# Patient Record
Sex: Female | Born: 2000 | Race: White | Hispanic: No | Marital: Single | Smoking: Former smoker
Health system: Southern US, Community
[De-identification: ages and names within clinical notes are randomized; demographics above are authoritative.]

## PROBLEM LIST (undated history)

## (undated) ENCOUNTER — Inpatient Hospital Stay (HOSPITAL_COMMUNITY): Payer: Self-pay

## (undated) DIAGNOSIS — D649 Anemia, unspecified: Secondary | ICD-10-CM

---
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
## 2020-08-14 DIAGNOSIS — Z8669 Personal history of other diseases of the nervous system and sense organs: Secondary | ICD-10-CM | POA: Insufficient documentation

## 2020-09-09 ENCOUNTER — Emergency Department (HOSPITAL_COMMUNITY): Payer: Medicaid Other

## 2020-09-09 ENCOUNTER — Emergency Department (HOSPITAL_COMMUNITY)
Admission: EM | Admit: 2020-09-09 | Discharge: 2020-09-10 | Disposition: A | Discharge status: Home / Self Care | Payer: Medicaid Other | Attending: Emergency Medicine | Admitting: Emergency Medicine

## 2020-09-09 DIAGNOSIS — F17200 Nicotine dependence, unspecified, uncomplicated: Secondary | ICD-10-CM | POA: Insufficient documentation

## 2020-09-09 DIAGNOSIS — N939 Abnormal uterine and vaginal bleeding, unspecified: Secondary | ICD-10-CM | POA: Insufficient documentation

## 2020-09-09 DIAGNOSIS — R102 Pelvic and perineal pain: Secondary | ICD-10-CM

## 2020-09-09 DIAGNOSIS — R1030 Lower abdominal pain, unspecified: Secondary | ICD-10-CM | POA: Diagnosis not present

## 2020-09-09 LAB — CBC WITH DIFFERENTIAL/PLATELET
Abs Immature Granulocytes: 0.05 10*3/uL (ref 0.00–0.07)
Basophils Absolute: 0.1 10*3/uL (ref 0.0–0.1)
Basophils Relative: 1 %
Eosinophils Absolute: 0.4 10*3/uL (ref 0.0–0.5)
Eosinophils Relative: 5 %
HCT: 40.1 % (ref 36.0–46.0)
Hemoglobin: 12.5 g/dL (ref 12.0–15.0)
Immature Granulocytes: 1 %
Lymphocytes Relative: 41 %
Lymphs Abs: 3.7 10*3/uL (ref 0.7–4.0)
MCH: 25 pg — ABNORMAL LOW (ref 26.0–34.0)
MCHC: 31.2 g/dL (ref 30.0–36.0)
MCV: 80 fL (ref 80.0–100.0)
Monocytes Absolute: 0.6 10*3/uL (ref 0.1–1.0)
Monocytes Relative: 7 %
Neutro Abs: 4.1 10*3/uL (ref 1.7–7.7)
Neutrophils Relative %: 45 %
Platelets: 314 10*3/uL (ref 150–400)
RBC: 5.01 MIL/uL (ref 3.87–5.11)
RDW: 15.7 % — ABNORMAL HIGH (ref 11.5–15.5)
WBC: 8.9 10*3/uL (ref 4.0–10.5)
nRBC: 0 % (ref 0.0–0.2)

## 2020-09-09 LAB — URINALYSIS, ROUTINE W REFLEX MICROSCOPIC
Bilirubin Urine: NEGATIVE
Glucose, UA: NEGATIVE mg/dL
Ketones, ur: NEGATIVE mg/dL
Nitrite: NEGATIVE
Protein, ur: NEGATIVE mg/dL
Specific Gravity, Urine: 1.025 (ref 1.005–1.030)
pH: 5 (ref 5.0–8.0)

## 2020-09-09 LAB — I-STAT BETA HCG BLOOD, ED (MC, WL, AP ONLY): I-stat hCG, quantitative: <5 m[IU]/mL (ref <5)

## 2020-09-09 LAB — COMPREHENSIVE METABOLIC PANEL
ALT: 28 U/L (ref 0–44)
AST: 19 U/L (ref 15–41)
Albumin: 3.4 g/dL — ABNORMAL LOW (ref 3.5–5.0)
Alkaline Phosphatase: 74 U/L (ref 38–126)
Anion gap: 5 (ref 5–15)
BUN: 11 mg/dL (ref 6–20)
CO2: 24 mmol/L (ref 22–32)
Calcium: 8.7 mg/dL — ABNORMAL LOW (ref 8.9–10.3)
Chloride: 111 mmol/L (ref 98–111)
Creatinine, Ser: 0.69 mg/dL (ref 0.44–1.00)
GFR, Estimated: >60 mL/min (ref >60)
Glucose, Bld: 94 mg/dL (ref 70–99)
Potassium: 4.1 mmol/L (ref 3.5–5.1)
Sodium: 140 mmol/L (ref 135–145)
Total Bilirubin: 0.5 mg/dL (ref 0.3–1.2)
Total Protein: 6.6 g/dL (ref 6.5–8.1)

## 2020-09-09 MED ORDER — SODIUM CHLORIDE 0.9 % IV SOLN
2.0000 g | Freq: Once | INTRAVENOUS | Status: AC
  Administered 2020-09-10: 2 g via INTRAVENOUS
  Filled 2020-09-09: qty 20

## 2020-09-09 MED ORDER — HYDROMORPHONE HCL 1 MG/ML IJ SOLN
0.5000 mg | Freq: Once | INTRAMUSCULAR | Status: AC
  Administered 2020-09-10: 0.5 mg via INTRAVENOUS
  Filled 2020-09-09: qty 1

## 2020-09-09 MED ORDER — KETOROLAC TROMETHAMINE 30 MG/ML IJ SOLN
30.0000 mg | Freq: Once | INTRAMUSCULAR | Status: AC
  Administered 2020-09-10: 30 mg via INTRAVENOUS
  Filled 2020-09-09: qty 1

## 2020-09-09 MED ORDER — LACTATED RINGERS IV BOLUS
1000.0000 mL | Freq: Once | INTRAVENOUS | Status: AC
  Administered 2020-09-10: 1000 mL via INTRAVENOUS

## 2020-09-09 MED ORDER — ACETAMINOPHEN 500 MG PO TABS
1000.0000 mg | ORAL_TABLET | Freq: Once | ORAL | Status: AC
  Administered 2020-09-10: 1000 mg via ORAL
  Filled 2020-09-09: qty 2

## 2020-09-09 MED ORDER — HYDROMORPHONE HCL 1 MG/ML IJ SOLN: 1.0000 mg | Freq: Once | INTRAMUSCULAR | Status: DC

## 2020-09-09 NOTE — ED Provider Notes (Signed)
Emergency Medicine Provider Triage Evaluation Note  Phyllis Abelson , a 20 y.o. female  was evaluated in triage.  Pt complains of vaginal bleeding, severe lower abdominal pain.  Patient started having vaginal bleeding 2 days ago.  Proved yesterday, worsened today.  She reports severe lower abdominal pain and cramping.  States that she was seen at an ED in [place] today and had a negative pregnancy test.  Review of Systems  Positive: Vaginal bleeding, lower abdominal pain Negative: Fever  Physical Exam  BP 134/73 (BP Location: Right Arm)   Pulse 65   Temp 98.2 F (36.8 C) (Oral)   Resp 18   SpO2 98%  Gen:   Awake, no distress   Resp:  Normal effort  MSK:   Moves extremities without difficulty Other:    Medical Decision Making  Medically screening exam initiated at 10:04 PM.  Appropriate orders placed.  Janaisa Birkland was informed that the remainder of the evaluation will be completed by another provider, this initial triage assessment does not replace that evaluation, and the importance of remaining in the ED until their evaluation is complete.     Renne Crigler, PA-C 09/09/20 2206    Benjiman Core, MD 09/09/20 2350
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
## 2020-09-09 NOTE — ED Triage Notes (Signed)
Pt c/o 10/10 abdominal pain & "very heavy bleeding" x2-3 days. 1-2 pads per hour. Was told that next pregnancy could be miscarriage, worried for same. LMP 6/14

## 2020-09-10 ENCOUNTER — Emergency Department (HOSPITAL_COMMUNITY): Payer: Medicaid Other

## 2020-09-10 LAB — WET PREP, GENITAL
Clue Cells Wet Prep HPF POC: NONE SEEN
Sperm: NONE SEEN
Trich, Wet Prep: NONE SEEN
Yeast Wet Prep HPF POC: NONE SEEN

## 2020-09-10 MED ORDER — CEPHALEXIN 500 MG PO CAPS: 500.0000 mg | ORAL_CAPSULE | Freq: Four times a day (QID) | ORAL | 0 refills | Status: DC

## 2020-09-10 MED ORDER — NORGESTIMATE-ETH ESTRADIOL 0.25-35 MG-MCG PO TABS: 1.0000 | ORAL_TABLET | Freq: Every day | ORAL | 11 refills | Status: DC

## 2020-09-10 MED ORDER — NORGESTIMATE-ETH ESTRADIOL 0.25-35 MG-MCG PO TABS: 4.0000 | ORAL_TABLET | Freq: Every day | ORAL | 0 refills | Status: DC

## 2020-09-10 NOTE — ED Notes (Signed)
Pt declines dilaudid at this time citing h/o panic attacks after pain meds.  Pt states she prefers to see if tylenol and toradol will improve pain and if not will alert staff that she is willing to take dilaudid.

## 2020-09-10 NOTE — ED Notes (Signed)
Lab to add urine culture.  

## 2020-09-10 NOTE — ED Notes (Signed)
Patient verbalizes understanding of discharge instructions. Opportunity for questioning and answers were provided. Armband removed by staff, pt discharged from ED and ambulated to lobby to return home with SO.  

## 2020-09-11 LAB — URINE CULTURE

## 2020-09-11 LAB — GC/CHLAMYDIA PROBE AMP (~~LOC~~) NOT AT ARMC
Chlamydia: NEGATIVE
Comment: NEGATIVE
Comment: NORMAL
Neisseria Gonorrhea: NEGATIVE

## 2020-12-30 ENCOUNTER — Other Ambulatory Visit: Payer: Self-pay

## 2020-12-30 ENCOUNTER — Emergency Department (HOSPITAL_COMMUNITY)
Admission: EM | Admit: 2020-12-30 | Discharge: 2020-12-31 | Disposition: A | Discharge status: Home / Self Care | Payer: Medicaid Other | Attending: Emergency Medicine | Admitting: Emergency Medicine

## 2020-12-30 ENCOUNTER — Encounter (HOSPITAL_COMMUNITY): Payer: Self-pay

## 2020-12-30 DIAGNOSIS — Y92410 Unspecified street and highway as the place of occurrence of the external cause: Secondary | ICD-10-CM | POA: Insufficient documentation

## 2020-12-30 DIAGNOSIS — R519 Headache, unspecified: Secondary | ICD-10-CM | POA: Insufficient documentation

## 2020-12-30 DIAGNOSIS — Z5321 Procedure and treatment not carried out due to patient leaving prior to being seen by health care provider: Secondary | ICD-10-CM | POA: Diagnosis not present
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
## 2021-06-27 ENCOUNTER — Other Ambulatory Visit: Payer: Self-pay

## 2021-06-27 ENCOUNTER — Inpatient Hospital Stay (HOSPITAL_COMMUNITY)
Admission: AD | Admit: 2021-06-27 | Discharge: 2021-06-28 | Disposition: A | Discharge status: Home / Self Care | Payer: Medicaid Other | Attending: Obstetrics and Gynecology | Admitting: Obstetrics and Gynecology

## 2021-06-27 ENCOUNTER — Encounter (HOSPITAL_COMMUNITY): Payer: Self-pay | Admitting: Obstetrics and Gynecology

## 2021-06-27 DIAGNOSIS — M7989 Other specified soft tissue disorders: Secondary | ICD-10-CM | POA: Diagnosis not present

## 2021-06-27 DIAGNOSIS — Z3A35 35 weeks gestation of pregnancy: Secondary | ICD-10-CM | POA: Insufficient documentation

## 2021-06-27 DIAGNOSIS — R1010 Upper abdominal pain, unspecified: Secondary | ICD-10-CM | POA: Insufficient documentation

## 2021-06-27 DIAGNOSIS — R609 Edema, unspecified: Secondary | ICD-10-CM

## 2021-06-27 DIAGNOSIS — O99333 Smoking (tobacco) complicating pregnancy, third trimester: Secondary | ICD-10-CM | POA: Diagnosis not present

## 2021-06-27 DIAGNOSIS — R109 Unspecified abdominal pain: Secondary | ICD-10-CM | POA: Diagnosis not present

## 2021-06-27 DIAGNOSIS — O26899 Other specified pregnancy related conditions, unspecified trimester: Secondary | ICD-10-CM | POA: Diagnosis not present

## 2021-06-27 DIAGNOSIS — Z79899 Other long term (current) drug therapy: Secondary | ICD-10-CM | POA: Insufficient documentation

## 2021-06-27 DIAGNOSIS — O26893 Other specified pregnancy related conditions, third trimester: Secondary | ICD-10-CM | POA: Insufficient documentation

## 2021-06-27 DIAGNOSIS — R110 Nausea: Secondary | ICD-10-CM

## 2021-06-27 HISTORY — DX: Anemia, unspecified: D64.9

## 2021-06-27 LAB — URINALYSIS, ROUTINE W REFLEX MICROSCOPIC
Bilirubin Urine: NEGATIVE
Glucose, UA: NEGATIVE mg/dL
Hgb urine dipstick: NEGATIVE
Ketones, ur: NEGATIVE mg/dL
Nitrite: NEGATIVE
Protein, ur: NEGATIVE mg/dL
Specific Gravity, Urine: 1.018 (ref 1.005–1.030)
pH: 6 (ref 5.0–8.0)

## 2021-06-27 LAB — CBC
HCT: 33 % — ABNORMAL LOW (ref 36.0–46.0)
Hemoglobin: 10.6 g/dL — ABNORMAL LOW (ref 12.0–15.0)
MCH: 25.7 pg — ABNORMAL LOW (ref 26.0–34.0)
MCHC: 32.1 g/dL (ref 30.0–36.0)
MCV: 79.9 fL — ABNORMAL LOW (ref 80.0–100.0)
Platelets: 254 10*3/uL (ref 150–400)
RBC: 4.13 MIL/uL (ref 3.87–5.11)
RDW: 14.6 % (ref 11.5–15.5)
WBC: 12.1 10*3/uL — ABNORMAL HIGH (ref 4.0–10.5)
nRBC: 0 % (ref 0.0–0.2)

## 2021-06-27 MED ORDER — ACETAMINOPHEN 500 MG PO TABS
1000.0000 mg | ORAL_TABLET | Freq: Once | ORAL | Status: AC
  Administered 2021-06-27: 1000 mg via ORAL
  Filled 2021-06-27: qty 2

## 2021-06-27 MED ORDER — ALUM & MAG HYDROXIDE-SIMETH 200-200-20 MG/5ML PO SUSP
30.0000 mL | Freq: Once | ORAL | Status: AC
Start: 2021-06-27 — End: 2021-06-27
  Administered 2021-06-27: 30 mL via ORAL
  Filled 2021-06-27: qty 30

## 2021-06-27 MED ORDER — LIDOCAINE VISCOUS HCL 2 % MT SOLN
15.0000 mL | Freq: Once | OROMUCOSAL | Status: AC
  Administered 2021-06-27: 15 mL via ORAL
  Filled 2021-06-27: qty 15

## 2021-06-27 NOTE — MAU Note (Signed)
Latasha Peters is a 21 y.o. at Unknown here in MAU reporting: Pt reports at 0200 she had a lot of vaginal pressure where she couldn't close her legs. Pt reports her hands and feet started swelling at 1300. Pt reports red patches on her face 30 minutes after the swelling started. Pt reports sharp pain in her upper abdomen.  ?Onset of complaint: this morning  ?Pain score: 8/10 upper abdomen ?There were no vitals filed for this visit.   ? ?Lab orders placed from triage:  UA  ?

## 2021-06-27 NOTE — MAU Provider Note (Signed)
?History  ?  ? ?CSN: [id number] ? ?Arrival date and time: 06/27/21 2202 ? ? None  ?  ? ?Chief Complaint  ?Patient presents with  ? Abdominal Pain  ? ?HPI ?Latasha Peters is a 21 y.o. G2P1001 at [redacted]w[redacted]d who presents to MAU for swelling in hands, feet and face that started around 1pm today. She denies elevated BP, headache, vision change, or RUQ pain. She reports at the same time, she started having some left upper abdominal cramping, worse when baby kicks. Had an episode of vomiting around 6pm, but this has been normal for her pregnancy. She took Zofran after the vomiting and no longer has nausea. She also reports some vaginal pressure that started last night. The pressure was so bad she couldn't close her legs. She denies contractions, vaginal bleeding, or leaking fluid. Endorses active fetal movement.  ? ?Patient receives [facility].  ? ?OB History   ? ? Gravida  ?2  ? Para  ?1  ? Term  ?1  ? Preterm  ?   ? AB  ?   ? Living  ?1  ?  ? ? SAB  ?   ? IAB  ?   ? Ectopic  ?   ? Multiple  ?   ? Live Births  ?1  ?   ?  ?  ? ? ?Past Medical History:  ?Diagnosis Date  ? Anemia   ? Chiari malformation type I (HCC) 2021  ? ? ?Past Surgical History:  ?Procedure Laterality Date  ? TONSILLECTOMY  2010  ? ? ?History reviewed. No pertinent family history. ? ?Social History  ? ?Tobacco Use  ? Smoking status: Light Smoker  ?  Packs/day: 0.50  ?  Types: Cigarettes  ? Smokeless tobacco: Never  ?Vaping Use  ? Vaping Use: Former  ?Substance Use Topics  ? Alcohol use: Never  ? Drug use: Never  ? ? ?Allergies:  ?Allergies  ?Allergen Reactions  ? Latex Itching and Rash  ? ? ?No medications prior to admission.  ? ? ?Review of Systems  ?Constitutional: Negative.   ?Respiratory: Negative.    ?Cardiovascular: Negative.   ?Gastrointestinal:  Positive for abdominal pain.  ?Genitourinary: Negative.   ?Musculoskeletal:   ?     Swelling hands, feet, face  ?Neurological: Negative.   ?Physical Exam  ? ?Patient Vitals for the past 24 hrs: ? BP Temp  Pulse Resp SpO2  ?06/27/21 2341 115/61 -- 88 -- --  ?06/27/21 2321 122/67 -- 92 -- --  ?06/27/21 2320 122/67 -- 92 -- 99 %  ?06/27/21 2301 121/67 -- 98 -- --  ?06/27/21 2241 120/70 -- 96 -- --  ?06/27/21 2240 120/70 -- 96 -- 99 %  ?06/27/21 2237 119/67 -- 94 -- --  ?06/27/21 2217 130/86 98.4 ?F (36.9 ?C) 89 20 98 %  ? ?Physical Exam ?Vitals and nursing note reviewed. Exam conducted with a chaperone present.  ?Constitutional:   ?   General: She is not in acute distress. ?Cardiovascular:  ?   Rate and Rhythm: Normal rate.  ?Pulmonary:  ?   Effort: Pulmonary effort is normal.  ?Abdominal:  ?   Palpations: Abdomen is soft.  ?   Tenderness: There is no abdominal tenderness.  ?   Comments: Gravid  ?Genitourinary: ?   Comments: VE: 1/50/-3, vtx ?Neurological:  ?   General: No focal deficit present.  ?   Mental Status: She is alert and oriented to person, place, and time.  ?Psychiatric:     ?  Mood and Affect: Mood normal.     ?   Behavior: Behavior normal.  ? ?NST ?FHR: 120 bpm, moderate variability, +15x15 accels, no decels ?Toco: Occasional  ? ?MAU Course  ?Procedures ?NST ? ?MDM ?UA, culture pending ?CBC, CMP, Lipase, UPCR unremarkable ?Serial BP's-all normotensive ?1000mg  Tylenol PO, GI Cocktail ?Cervix 1/50/-3, NST reactive and reassuring. Toco with occasional/sporadic contractions. Patient is not in active labor nor has pre-eclampsia. Patient has appointment tomorrow with her OBGYN and instructed to follow up then. ? ?Assessment and Plan  ?[redacted] weeks gestation of pregnancy ?Swelling ?Nausea ?Abdominal pain during pregnancy ? ?- Discharge home in stable condition ?- Keep OB appointment as scheduled tomorrow 4/28. Return to MAU sooner or as needed for worsening symptoms ? ? ?5/28, CNM ?06/28/2021, 1:09 AM  ?

## 2021-06-28 DIAGNOSIS — Z3A35 35 weeks gestation of pregnancy: Secondary | ICD-10-CM

## 2021-06-28 DIAGNOSIS — R109 Unspecified abdominal pain: Secondary | ICD-10-CM

## 2021-06-28 DIAGNOSIS — O26899 Other specified pregnancy related conditions, unspecified trimester: Secondary | ICD-10-CM

## 2021-06-28 LAB — COMPREHENSIVE METABOLIC PANEL
ALT: 19 U/L (ref 0–44)
AST: 19 U/L (ref 15–41)
Albumin: 2.3 g/dL — ABNORMAL LOW (ref 3.5–5.0)
Alkaline Phosphatase: 186 U/L — ABNORMAL HIGH (ref 38–126)
Anion gap: 6 (ref 5–15)
BUN: <5 mg/dL — ABNORMAL LOW (ref 6–20)
CO2: 21 mmol/L — ABNORMAL LOW (ref 22–32)
Calcium: 8.3 mg/dL — ABNORMAL LOW (ref 8.9–10.3)
Chloride: 109 mmol/L (ref 98–111)
Creatinine, Ser: 0.46 mg/dL (ref 0.44–1.00)
GFR, Estimated: >60 mL/min (ref >60)
Glucose, Bld: 101 mg/dL — ABNORMAL HIGH (ref 70–99)
Potassium: 3.7 mmol/L (ref 3.5–5.1)
Sodium: 136 mmol/L (ref 135–145)
Total Bilirubin: 0.2 mg/dL — ABNORMAL LOW (ref 0.3–1.2)
Total Protein: 5.8 g/dL — ABNORMAL LOW (ref 6.5–8.1)

## 2021-06-28 LAB — PROTEIN / CREATININE RATIO, URINE
Creatinine, Urine: 164.43 mg/dL
Protein Creatinine Ratio: 0.11 mg/mg{Cre} (ref 0.00–0.15)
Total Protein, Urine: 18 mg/dL

## 2021-06-28 LAB — LIPASE, BLOOD: Lipase: 27 U/L (ref 11–51)

## 2021-06-29 LAB — CULTURE, OB URINE: Culture: 20000 — AB

## 2021-07-05 ENCOUNTER — Other Ambulatory Visit: Payer: Self-pay

## 2021-07-05 ENCOUNTER — Encounter (HOSPITAL_COMMUNITY): Payer: Self-pay | Admitting: Family Medicine

## 2021-07-05 ENCOUNTER — Inpatient Hospital Stay (HOSPITAL_COMMUNITY)
Admission: AD | Admit: 2021-07-05 | Discharge: 2021-07-06 | Disposition: A | Discharge status: Home / Self Care | Payer: Medicaid Other | Attending: Family Medicine | Admitting: Family Medicine

## 2021-07-05 DIAGNOSIS — F17210 Nicotine dependence, cigarettes, uncomplicated: Secondary | ICD-10-CM | POA: Insufficient documentation

## 2021-07-05 DIAGNOSIS — Z79899 Other long term (current) drug therapy: Secondary | ICD-10-CM | POA: Insufficient documentation

## 2021-07-05 DIAGNOSIS — N898 Other specified noninflammatory disorders of vagina: Secondary | ICD-10-CM

## 2021-07-05 DIAGNOSIS — O99333 Smoking (tobacco) complicating pregnancy, third trimester: Secondary | ICD-10-CM | POA: Insufficient documentation

## 2021-07-05 DIAGNOSIS — O479 False labor, unspecified: Secondary | ICD-10-CM

## 2021-07-05 DIAGNOSIS — Z3A36 36 weeks gestation of pregnancy: Secondary | ICD-10-CM

## 2021-07-05 LAB — POCT FERN TEST: POCT Fern Test: NEGATIVE

## 2021-07-05 NOTE — MAU Note (Signed)
Latasha Peters is a 21 y.o. at [redacted]w[redacted]d here in MAU reporting: Pt reports ctx's that started 1-2 hours ago. Py reports a small leak before the ctx's started.  ?Reports bloody show in mucous plug.  ?Reports +FM  ? ?Onset of complaint: today ?Pain score: 10/10 ?There were no vitals filed for this visit.   ? ?Lab orders placed from triage:  none ?

## 2021-07-05 NOTE — MAU Provider Note (Signed)
Chief Complaint:  Contractions ? ? Event Date/Time  ? First Provider Initiated Contact with Patient 07/05/21 2110   ? ? HPI: Latasha Peters is a 21 y.o. G2P1001 at 65w4dwho presents to maternity admissions reporting painful contractions and possible leaking of fluid. [name] ?She reports good fetal movement, denies urinary symptoms, h/a, dizziness, n/v, diarrhea, constipation or fever/chills.. ? ?Vaginal Discharge ?The patient's primary symptoms include pelvic pain and vaginal discharge. The patient's pertinent negatives include no genital itching or genital odor. This is a new problem. The current episode started today. The problem has been unchanged. Associated symptoms include abdominal pain. Pertinent negatives include no constipation, diarrhea, fever, frequency, nausea or vomiting. The vaginal discharge was white and watery. There has been no bleeding. She has not been passing clots. She has not been passing tissue. Nothing aggravates the symptoms. She has tried nothing for the symptoms.  ? ? ?RN Note: ?[name] is a 21 y.o. at [redacted]w[redacted]d here in MAU reporting: Pt reports ctx's that started 1-2 hours ago. Py reports a small leak before the ctx's started.  ?Reports bloody show in mucous plug.  ?Reports +FM   ?Onset of complaint: today ?Pain score: 10/10 ? ?Past Medical History: ?Past Medical History:  ?Diagnosis Date  ? Anemia   ? Chiari malformation type I (HCC) 2021  ? ? ?Past obstetric history: ?OB History  ?Gravida Para Term Preterm AB Living  ?2 1 1     1   ?SAB IAB Ectopic Multiple Live Births  ?        1  ?  ?# Outcome Date GA Lbr Len/2nd Weight Sex Delivery Anes PTL Lv  ?2 Current           ?1 Term 2021     Vag-Spont   LIV  ? ? ?Past Surgical History: ?Past Surgical History:  ?Procedure Laterality Date  ? TONSILLECTOMY  2010  ? ? ?Family History: ?History reviewed. No pertinent family history. ? ?Social History: ?Social History  ? ?Tobacco Use  ? Smoking status: Light Smoker  ?  Packs/day: 0.50  ?  Types:  Cigarettes  ? Smokeless tobacco: Never  ?Vaping Use  ? Vaping Use: Former  ?Substance Use Topics  ? Alcohol use: Never  ? Drug use: Never  ? ? ?Allergies:  ?Allergies  ?Allergen Reactions  ? Latex Itching and Rash  ? ? ?Meds:  ?Medications Prior to Admission  ?Medication Sig Dispense Refill Last Dose  ? acetaminophen (TYLENOL) 650 MG CR tablet Take 1,300 mg by mouth every 8 (eight) hours as needed for pain.     ? cyclobenzaprine (FLEXERIL) 10 MG tablet Take 1 tablet (10 mg total) by mouth 3 (three) times daily as needed. (Patient not taking: Reported on 09/10/2020) 30 tablet 0   ? Elastic Bandages & Supports (COMFORT FIT MATERNITY SUPP SM) MISC 1 Units by Does not apply route daily as needed. (Patient not taking: Reported on 09/10/2020) 1 each 0   ? venlafaxine XR (EFFEXOR-XR) 37.5 MG 24 hr capsule Take 37.5 mg by mouth daily.     ? ? ?I have reviewed patient's Past Medical Hx, Surgical Hx, Family Hx, Social Hx, medications and allergies.  ? ?ROS:  ?Review of Systems  ?Constitutional:  Negative for fever.  ?Gastrointestinal:  Positive for abdominal pain. Negative for constipation, diarrhea, nausea and vomiting.  ?Genitourinary:  Positive for pelvic pain and vaginal discharge. Negative for frequency.  ?Other systems negative ? ?Physical Exam  ?Patient Vitals for the past 24 hrs: ? BP  Temp Pulse Resp SpO2  ?07/05/21 2058 129/81 -- (!) 108 -- --  ?07/05/21 2057 -- 98.3 ?F (36.8 ?C) -- 16 99 %  ? ?Constitutional: Well-developed, well-nourished female in no acute distress.  ?Cardiovascular: normal rate and rhythm ?Respiratory: normal effort, clear to auscultation bilaterally ?GI: Abd soft, non-tender, gravid appropriate for gestational age.   No rebound or guarding. ?MS: Extremities nontender, no edema, normal ROM ?Neurologic: Alert and oriented x 4.  ?GU: Neg CVAT. ? ?PELVIC EXAM:  ?Sterile speculum exam done:  Thick mucous discharge noted. No pooling , no ferning noted.  DIscussed with patient that this means she is not  ruptured ?Dilation: 2 ?Effacement (%): 50 ?Station: -3 ?Presentation: Vertex ?Exam by:: Beather Arbour ? ?FHT:  Baseline 140 , moderate variability, accelerations present, no decelerations ?Contractions:  Irregular   ?  ?Labs: ?Results for orders placed or performed during the hospital encounter of 07/05/21 (from the past 24 hour(s))  ?Fern Test     Status: None  ? Collection Time: 07/05/21  9:50 PM  ?Result Value Ref Range  ? POCT Fern Test Negative = intact amniotic membranes   ? ? ? ? Imaging:  ?No results found. ? ?MAU Course/MDM: ?I have reviewed the triage vital signs and the nursing notes. ?  ?Pertinent labs that were available during my care of the patient were reviewed by me and considered in my medical decision making (see chart for details).      I have reviewed her medical records including past results, notes and treatments.  ? ?NST reviewed, reactive by criteria, category I ?.  ?Treatments in MAU included NST, SSExam.   ? ?Assessment: ?Single IUP at [redacted]w[redacted]d ?Irregular uterine contractions, prodromal vs latent labor ?NST reactive ?No evidence of ruptured membranes ? ?Plan: ?Discharge home ?Labor precautions and fetal kick counts ?Follow up in Office for prenatal visits  ?Encouraged to return if she develops worsening of symptoms, increase in pain, fever, or other concerning symptoms. ? ?Pt stable at time of discharge. ? ?Wynelle Bourgeois CNM, MSN ?Certified Nurse-Midwife ?07/05/2021 ?9:16 PM ?

## 2021-07-05 NOTE — Progress Notes (Signed)
RN offered pt to go home after unchanged SVE or to stay another hour due to pt's level of discomfort. Pt states it would take her ride an hour to get here. RN informed pt that we can keep her another hour and offer another SVE. RN informed Dr. Adah Salvage of discussion with pt along with SVE, membrane status, and uterine activity. Dr. Adah Salvage stated it is ok to d/c pt after 1 hour and recheck cervix before she leaves.  ?

## 2021-07-06 DIAGNOSIS — Z3A36 36 weeks gestation of pregnancy: Secondary | ICD-10-CM | POA: Diagnosis not present

## 2021-07-06 DIAGNOSIS — N898 Other specified noninflammatory disorders of vagina: Secondary | ICD-10-CM | POA: Diagnosis not present

## 2021-07-06 DIAGNOSIS — Z79899 Other long term (current) drug therapy: Secondary | ICD-10-CM | POA: Diagnosis not present

## 2021-07-06 DIAGNOSIS — O479 False labor, unspecified: Secondary | ICD-10-CM

## 2021-07-06 DIAGNOSIS — O26893 Other specified pregnancy related conditions, third trimester: Secondary | ICD-10-CM | POA: Diagnosis not present

## 2021-07-06 DIAGNOSIS — F17210 Nicotine dependence, cigarettes, uncomplicated: Secondary | ICD-10-CM | POA: Diagnosis not present

## 2021-07-06 DIAGNOSIS — O99333 Smoking (tobacco) complicating pregnancy, third trimester: Secondary | ICD-10-CM | POA: Diagnosis not present

## 2022-10-05 IMAGING — US US PELVIS COMPLETE
1 series · 14 of 25 positions shown · non-contrast
Comparison: None.

CLINICAL DATA: Pelvic pain and heavy bleeding

EXAM:
TRANSABDOMINAL ULTRASOUND OF PELVIS
DOPPLER ULTRASOUND OF OVARIES
TECHNIQUE: Transabdominal ultrasound examination of the pelvis was performed
including evaluation of the uterus, ovaries, adnexal regions, and
pelvic cul-de-sac. Transvaginal imaging was refused by the patient.
Color and duplex Doppler ultrasound was utilized to evaluate blood
flow to the ovaries.

[Series 1: us pelvis (transabdominal only) · 14 of 52 slices shown]
[im 1/52]
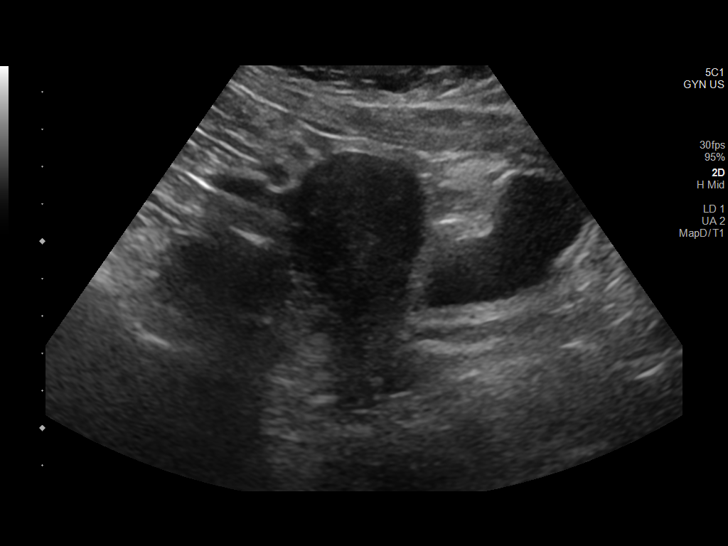
[im 5/52]
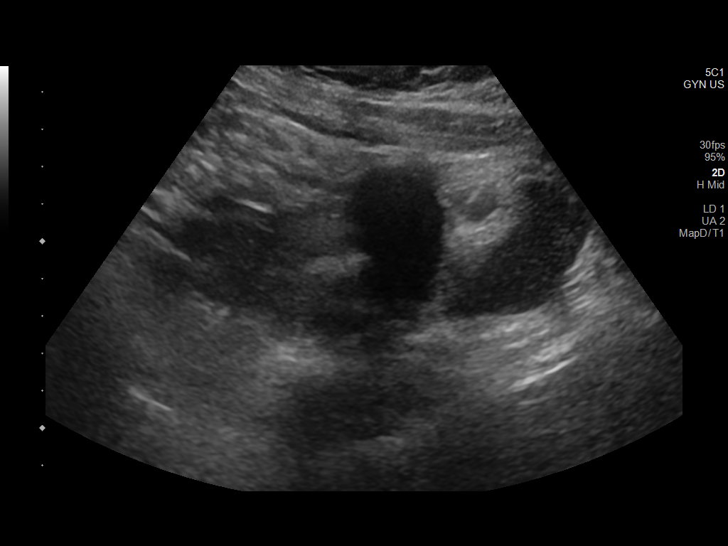
[im 9/52]
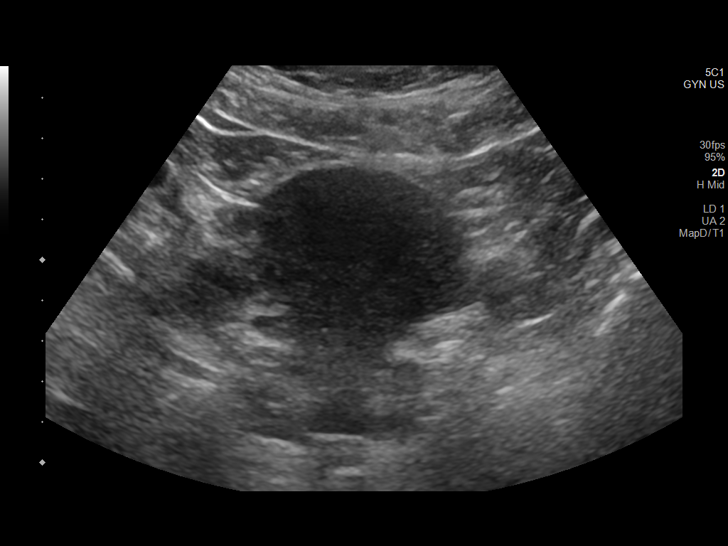
[im 13/52]
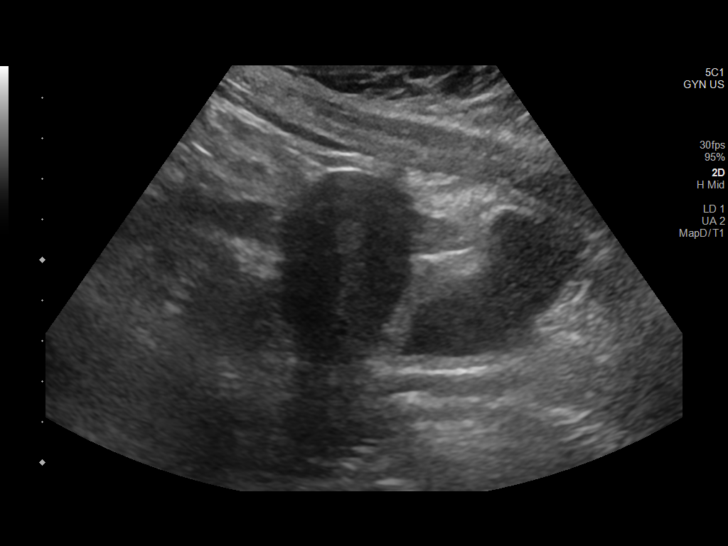
[im 18/52]
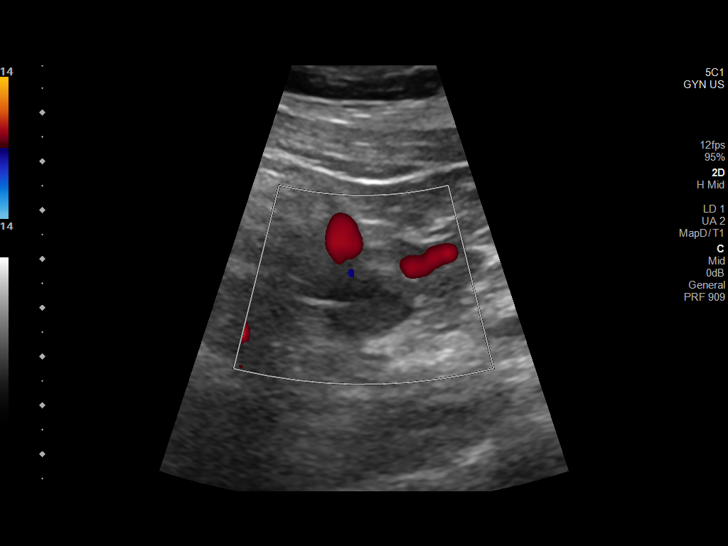
[im 20/52]
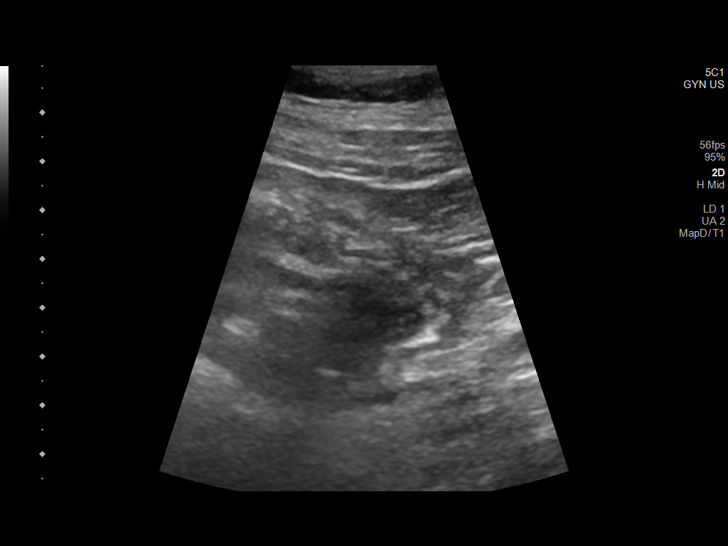
[im 24/52]
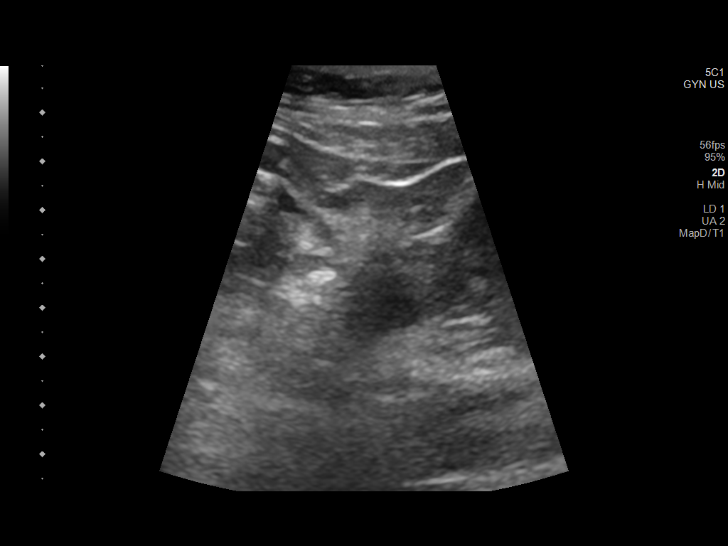
[im 28/52]
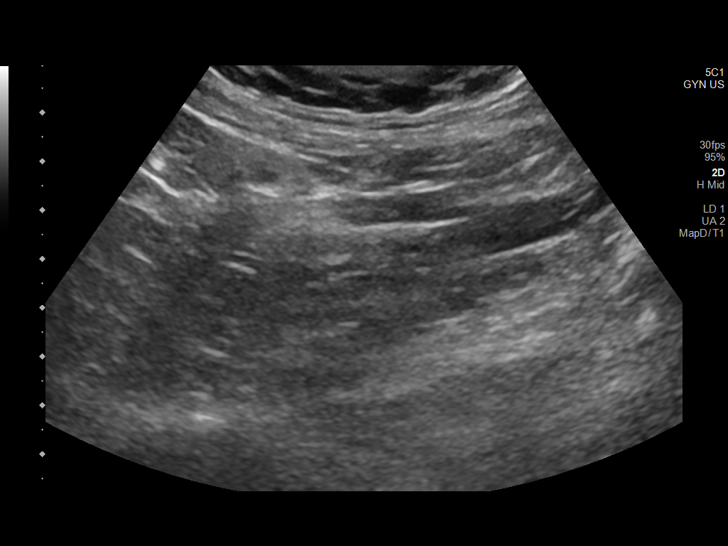
[im 32/52]
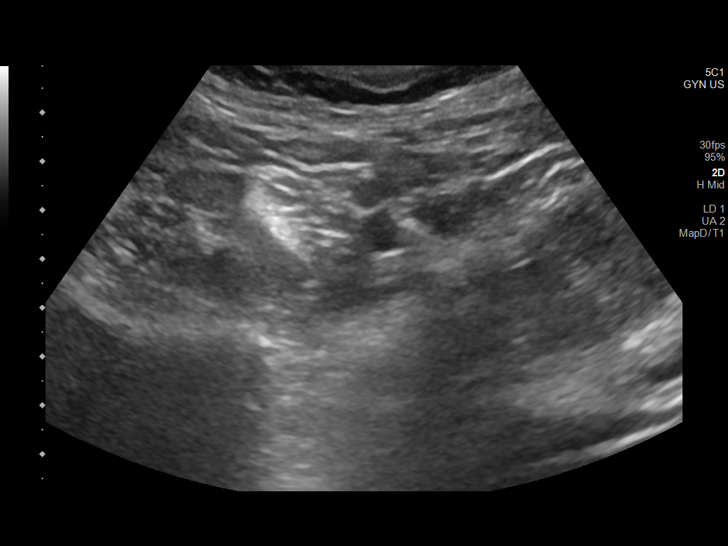
[im 35/52]
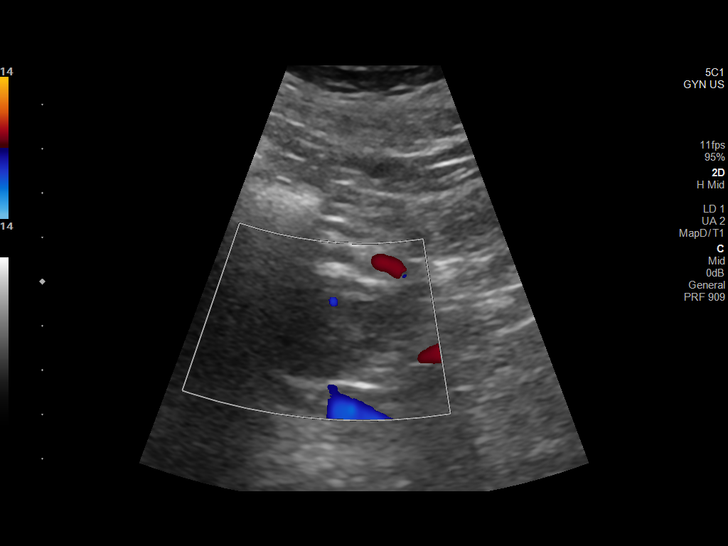
[im 39/52]
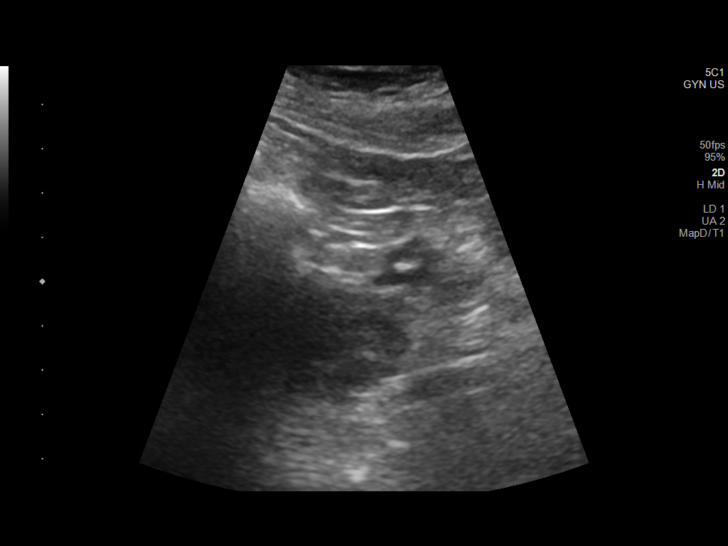
[im 43/52]
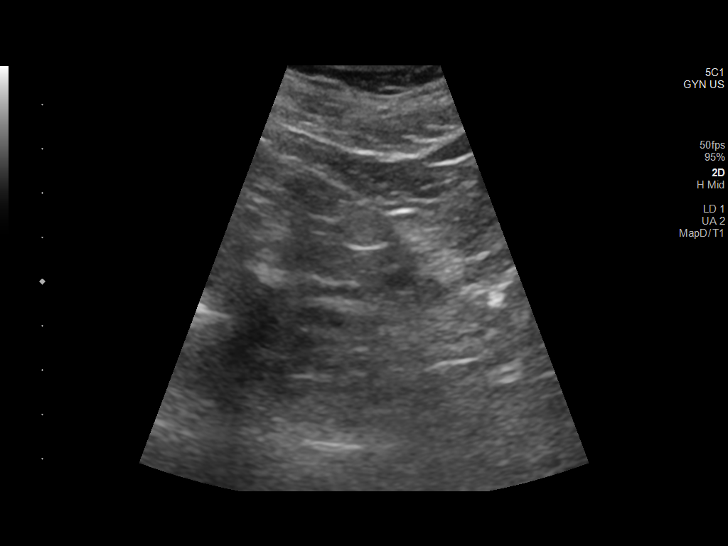
[im 47/52]
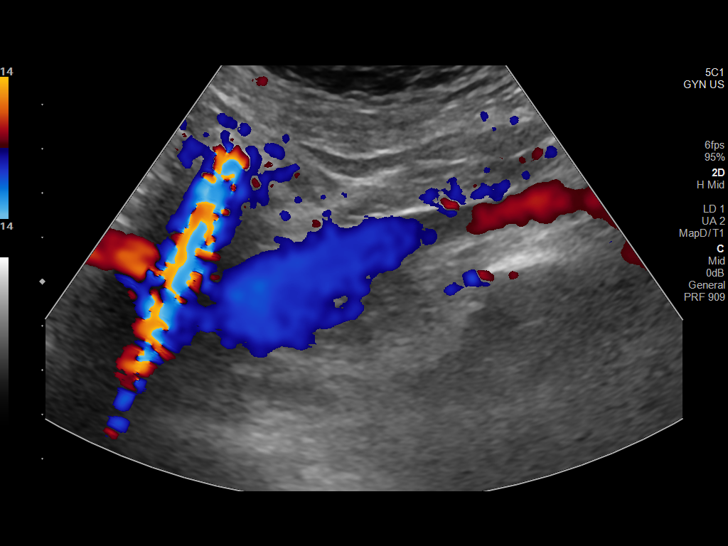
[im 52/52]
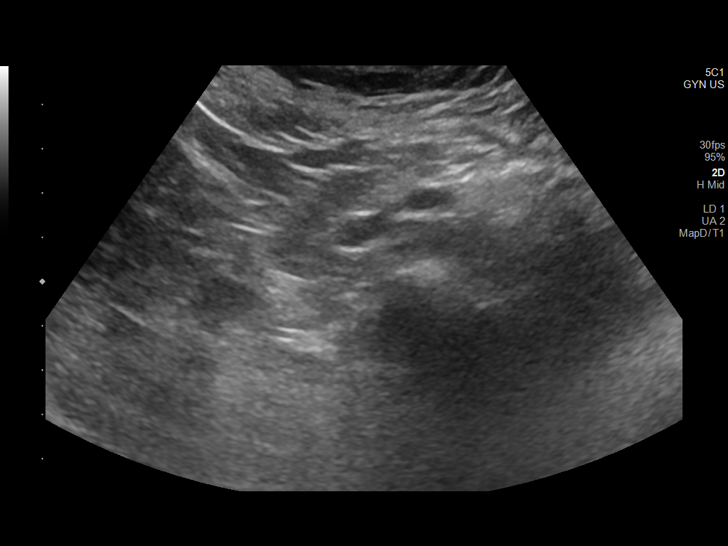

[14 of 25 positions shown; findings below may reference images not displayed]

FINDINGS: Uterus

Measurements: 6.5 x 4.4 x 5.2 cm. = volume: 77 mL. No fibroids or
other mass visualized.

Endometrium

Thickness: 8 mm.  No focal abnormality visualized.

Right ovary

Measurements: 2.0 x 1.5 x 1.7 cm. = volume: 2.7 mL. Normal
appearance/no adnexal mass.

Left ovary

Measurements: 2.6 x 1.7 x 3.0 cm. = volume: 8.7 mL. Normal
appearance/no adnexal mass.

Pulsed Doppler evaluation demonstrates normal low-resistance
arterial and venous waveforms in both ovaries.

Other: None
IMPRESSION: Unremarkable uterus and ovaries. No evidence of ovarian torsion is
seen.

## 2023-02-03 DIAGNOSIS — F419 Anxiety disorder, unspecified: Secondary | ICD-10-CM | POA: Insufficient documentation

## 2023-02-03 DIAGNOSIS — F32A Depression, unspecified: Secondary | ICD-10-CM | POA: Insufficient documentation

## 2023-06-16 ENCOUNTER — Other Ambulatory Visit: Payer: Self-pay | Admitting: Medical Genetics

## 2023-08-25 ENCOUNTER — Other Ambulatory Visit (HOSPITAL_COMMUNITY)

## 2023-09-09 ENCOUNTER — Encounter: Payer: Self-pay | Admitting: Women's Health

## 2023-09-09 DIAGNOSIS — O26899 Other specified pregnancy related conditions, unspecified trimester: Secondary | ICD-10-CM | POA: Insufficient documentation

## 2023-09-15 ENCOUNTER — Ambulatory Visit (INDEPENDENT_AMBULATORY_CARE_PROVIDER_SITE_OTHER): Admitting: Women's Health

## 2023-09-15 ENCOUNTER — Encounter: Payer: Self-pay | Admitting: Women's Health

## 2023-09-15 ENCOUNTER — Ambulatory Visit: Admitting: *Deleted

## 2023-09-15 ENCOUNTER — Other Ambulatory Visit (HOSPITAL_COMMUNITY)
Admission: RE | Admit: 2023-09-15 | Discharge: 2023-09-15 | Disposition: A | Discharge status: Home / Self Care | Source: Ambulatory Visit | Attending: Women's Health | Admitting: Women's Health

## 2023-09-15 VITALS — BP 105/69 | HR 83 | Wt 156.0 lb

## 2023-09-15 DIAGNOSIS — O26891 Other specified pregnancy related conditions, first trimester: Secondary | ICD-10-CM

## 2023-09-15 DIAGNOSIS — F32A Depression, unspecified: Secondary | ICD-10-CM

## 2023-09-15 DIAGNOSIS — O0990 Supervision of high risk pregnancy, unspecified, unspecified trimester: Secondary | ICD-10-CM | POA: Insufficient documentation

## 2023-09-15 DIAGNOSIS — Z1332 Encounter for screening for maternal depression: Secondary | ICD-10-CM

## 2023-09-15 DIAGNOSIS — Z3A12 12 weeks gestation of pregnancy: Secondary | ICD-10-CM | POA: Diagnosis not present

## 2023-09-15 DIAGNOSIS — Z6791 Unspecified blood type, Rh negative: Secondary | ICD-10-CM | POA: Insufficient documentation

## 2023-09-15 DIAGNOSIS — F418 Other specified anxiety disorders: Secondary | ICD-10-CM

## 2023-09-15 DIAGNOSIS — Z31430 Encounter of female for testing for genetic disease carrier status for procreative management: Secondary | ICD-10-CM

## 2023-09-15 DIAGNOSIS — O26899 Other specified pregnancy related conditions, unspecified trimester: Secondary | ICD-10-CM

## 2023-09-15 DIAGNOSIS — Z3481 Encounter for supervision of other normal pregnancy, first trimester: Secondary | ICD-10-CM

## 2023-09-15 DIAGNOSIS — F419 Anxiety disorder, unspecified: Secondary | ICD-10-CM

## 2023-09-15 DIAGNOSIS — Z3480 Encounter for supervision of other normal pregnancy, unspecified trimester: Secondary | ICD-10-CM | POA: Insufficient documentation

## 2023-09-15 MED ORDER — BLOOD PRESSURE MONITOR MISC: 0 refills | Status: AC

## 2023-09-15 NOTE — Patient Instructions (Signed)
 [facility], thank you for choosing our office today! We appreciate the opportunity to meet your healthcare needs. You may receive a short survey by mail, e-mail, or through [employer]. If you are happy with your care we would appreciate if you could take just a few minutes to complete the survey questions. We read all of your comments and take your feedback very seriously. Thank you again for choosing our office.  [facility] [employer] [facility] Team at [facility]  [facility] at [facility] ([address] [place], [place] [address]) Entrance C, located off of [address] Free 24/7 valet parking   Nausea & Vomiting Have saltine crackers or pretzels by your bed and eat a few bites before you raise your head out of bed in the morning Eat small frequent meals throughout the day instead of large meals Drink plenty of fluids throughout the day to stay hydrated, just don't drink a lot of fluids with your meals.  This can make your stomach fill up faster making you feel sick Do not brush your teeth right after you eat Products with real ginger are good for nausea, like ginger ale and ginger hard candy Make sure it says made with real ginger! Sucking on sour candy like lemon heads is also good for nausea If your prenatal vitamins make you nauseated, take them at night so you will sleep through the nausea Sea Bands If you feel like you need medicine for the nausea & vomiting please let us know If you are unable to keep any fluids or food down please let us know   Constipation Drink plenty of fluid, preferably water, throughout the day Eat foods high in fiber such as fruits, vegetables, and grains Exercise, such as walking, is a good way to keep your bowels regular Drink warm fluids, especially warm prune juice, or decaf coffee Eat a 1/2 cup of real oatmeal (not instant), 1/2 cup applesauce, and 1/2-1 cup warm prune juice every day If needed, you may take Colace (docusate sodium) stool softener  once or twice a day to help keep the stool soft.  If you still are having problems with constipation, you may take Miralax once daily as needed to help keep your bowels regular.   Home Blood Pressure Monitoring for Patients   Your provider has recommended that you check your blood pressure (BP) at least once a week at home. If you do not have a blood pressure cuff at home, one will be provided for you. Contact your provider if you have not received your monitor within 1 week.   Helpful Tips for Accurate Home Blood Pressure Checks  Don't smoke, exercise, or drink caffeine 30 minutes before checking your BP Use the restroom before checking your BP (a full bladder can raise your pressure) Relax in a comfortable upright chair Feet on the ground Left arm resting comfortably on a flat surface at the level of your heart Legs uncrossed Back supported Sit quietly and don't talk Place the cuff on your bare arm Adjust snuggly, so that only two fingertips can fit between your skin and the top of the cuff Check 2 readings separated by at least one minute Keep a log of your BP readings For a visual, please reference this diagram: http://ccnc.care/bpdiagram  Provider Name: [facility]     Phone: [phone]  Zone 1: ALL CLEAR  Continue to monitor your symptoms:  BP reading is less than 140 (top number) or less than 90 (bottom  number)  No right upper stomach pain No headaches or seeing spots No feeling nauseated or throwing up No swelling in face and hands  Zone 2: CAUTION Call your doctor's office for any of the following:  BP reading is greater than 140 (top number) or greater than 90 (bottom number)  Stomach pain under your ribs in the middle or right side Headaches or seeing spots Feeling nauseated or throwing up Swelling in face and hands  Zone 3: EMERGENCY  Seek immediate medical care if you have any of the following:  BP reading is greater than160 (top number) or greater than  110 (bottom number) Severe headaches not improving with Tylenol Serious difficulty catching your breath Any worsening symptoms from Zone 2    First Trimester of Pregnancy The first trimester of pregnancy is from week 1 until the end of week 12 (months 1 through 3). A week after a sperm fertilizes an egg, the egg will implant on the wall of the uterus. This embryo will begin to develop into a baby. Genes from you and your partner are forming the baby. The female genes determine whether the baby is a boy or a girl. At 6-8 weeks, the eyes and face are formed, and the heartbeat can be seen on ultrasound. At the end of 12 weeks, all the baby's organs are formed.  Now that you are pregnant, you will want to do everything you can to have a healthy baby. Two of the most important things are to get good prenatal care and to follow your health care provider's instructions. Prenatal care is all the medical care you receive before the baby's birth. This care will help prevent, find, and treat any problems during the pregnancy and childbirth. BODY CHANGES Your body goes through many changes during pregnancy. The changes vary from woman to woman.  You may gain or lose a couple of pounds at first. You may feel sick to your stomach (nauseous) and throw up (vomit). If the vomiting is uncontrollable, call your health care provider. You may tire easily. You may develop headaches that can be relieved by medicines approved by your health care provider. You may urinate more often. Painful urination may mean you have a bladder infection. You may develop heartburn as a result of your pregnancy. You may develop constipation because certain hormones are causing the muscles that push waste through your intestines to slow down. You may develop hemorrhoids or swollen, bulging veins (varicose veins). Your breasts may begin to grow larger and become tender. Your nipples may stick out more, and the tissue that surrounds them  (areola) may become darker. Your gums may bleed and may be sensitive to brushing and flossing. Dark spots or blotches (chloasma, mask of pregnancy) may develop on your face. This will likely fade after the baby is born. Your menstrual periods will stop. You may have a loss of appetite. You may develop cravings for certain kinds of food. You may have changes in your emotions from day to day, such as being excited to be pregnant or being concerned that something may go wrong with the pregnancy and baby. You may have more vivid and strange dreams. You may have changes in your hair. These can include thickening of your hair, rapid growth, and changes in texture. Some women also have hair loss during or after pregnancy, or hair that feels dry or thin. Your hair will most likely return to normal after your baby is born. WHAT TO EXPECT AT YOUR PRENATAL  VISITS During a routine prenatal visit: You will be weighed to make sure you and the baby are growing normally. Your blood pressure will be taken. Your abdomen will be measured to track your baby's growth. The fetal heartbeat will be listened to starting around week 10 or 12 of your pregnancy. Test results from any previous visits will be discussed. Your health care provider may ask you: How you are feeling. If you are feeling the baby move. If you have had any abnormal symptoms, such as leaking fluid, bleeding, severe headaches, or abdominal cramping. If you have any questions. Other tests that may be performed during your first trimester include: Blood tests to find your blood type and to check for the presence of any previous infections. They will also be used to check for low iron levels (anemia) and Rh antibodies. Later in the pregnancy, blood tests for diabetes will be done along with other tests if problems develop. Urine tests to check for infections, diabetes, or protein in the urine. An ultrasound to confirm the proper growth and development  of the baby. An amniocentesis to check for possible genetic problems. Fetal screens for spina bifida and Down syndrome. You may need other tests to make sure you and the baby are doing well. HOME CARE INSTRUCTIONS  Medicines Follow your health care provider's instructions regarding medicine use. Specific medicines may be either safe or unsafe to take during pregnancy. Take your prenatal vitamins as directed. If you develop constipation, try taking a stool softener if your health care provider approves. Diet Eat regular, well-balanced meals. Choose a variety of foods, such as meat or vegetable-based protein, fish, milk and low-fat dairy products, vegetables, fruits, and whole grain breads and cereals. Your health care provider will help you determine the amount of weight gain that is right for you. Avoid raw meat and uncooked cheese. These carry germs that can cause birth defects in the baby. Eating four or five small meals rather than three large meals a day may help relieve nausea and vomiting. If you start to feel nauseous, eating a few soda crackers can be helpful. Drinking liquids between meals instead of during meals also seems to help nausea and vomiting. If you develop constipation, eat more high-fiber foods, such as fresh vegetables or fruit and whole grains. Drink enough fluids to keep your urine clear or pale yellow. Activity and Exercise Exercise only as directed by your health care provider. Exercising will help you: Control your weight. Stay in shape. Be prepared for labor and delivery. Experiencing pain or cramping in the lower abdomen or low back is a good sign that you should stop exercising. Check with your health care provider before continuing normal exercises. Try to avoid standing for long periods of time. Move your legs often if you must stand in one place for a long time. Avoid heavy lifting. Wear low-heeled shoes, and practice good posture. You may continue to have sex  unless your health care provider directs you otherwise. Relief of Pain or Discomfort Wear a good support bra for breast tenderness.   Take warm sitz baths to soothe any pain or discomfort caused by hemorrhoids. Use hemorrhoid cream if your health care provider approves.   Rest with your legs elevated if you have leg cramps or low back pain. If you develop varicose veins in your legs, wear support hose. Elevate your feet for 15 minutes, 3-4 times a day. Limit salt in your diet. Prenatal Care Schedule your prenatal visits by the  twelfth week of pregnancy. They are usually scheduled monthly at first, then more often in the last 2 months before delivery. Write down your questions. Take them to your prenatal visits. Keep all your prenatal visits as directed by your health care provider. Safety Wear your seat belt at all times when driving. Make a list of emergency phone numbers, including numbers for family, friends, the hospital, and police and fire departments. General Tips Ask your health care provider for a referral to a local prenatal education class. Begin classes no later than at the beginning of month 6 of your pregnancy. Ask for help if you have counseling or nutritional needs during pregnancy. Your health care provider can offer advice or refer you to specialists for help with various needs. Do not use hot tubs, steam rooms, or saunas. Do not douche or use tampons or scented sanitary pads. Do not cross your legs for long periods of time. Avoid cat litter boxes and soil used by cats. These carry germs that can cause birth defects in the baby and possibly loss of the fetus by miscarriage or stillbirth. Avoid all smoking, herbs, alcohol, and medicines not prescribed by your health care provider. Chemicals in these affect the formation and growth of the baby. Schedule a dentist appointment. At home, brush your teeth with a soft toothbrush and be gentle when you floss. SEEK MEDICAL CARE IF:   You have dizziness. You have mild pelvic cramps, pelvic pressure, or nagging pain in the abdominal area. You have persistent nausea, vomiting, or diarrhea. You have a bad smelling vaginal discharge. You have pain with urination. You notice increased swelling in your face, hands, legs, or ankles. SEEK IMMEDIATE MEDICAL CARE IF:  You have a fever. You are leaking fluid from your vagina. You have spotting or bleeding from your vagina. You have severe abdominal cramping or pain. You have rapid weight gain or loss. You vomit blood or material that looks like coffee grounds. You are exposed to [place] measles and have never had them. You are exposed to fifth disease or chickenpox. You develop a severe headache. You have shortness of breath. You have any kind of trauma, such as from a fall or a car accident. Document Released: 02/12/2001 Document Revised: 07/05/2013 Document Reviewed: 12/29/2012 [facility] Patient Information 2015 [place], [place]. This information is not intended to replace advice given to you by your health care provider. Make sure you discuss any questions you have with your health care provider.

## 2023-09-15 NOTE — Progress Notes (Signed)
 INITIAL OBSTETRICAL VISIT Patient name: Latasha Peters MRN [id number]  Date of birth: Dec 16, 2000 Chief Complaint:   Initial Prenatal Visit  History of Present Illness:   Latasha Peters is a 23 y.o. [phone] Caucasian female at [redacted]w[redacted]d by US  at 8 weeks with an Estimated Date of Delivery: 03/26/24 being seen today for her initial obstetrical visit.   Patient's last menstrual period was 05/25/2023 (approximate). Her obstetrical history is significant for SAB x 1, term uncomplicated SVB x 2. Chiari malformation, no problems Dep/anx- no meds in doing well, OK w/ [facility] referral   Today she reports n/v improving, declines meds.  Last pap never. Results were: N/A     09/15/2023    2:35 PM  Depression screen PHQ 2/9  Decreased Interest 0  Down, Depressed, Hopeless 0  PHQ - 2 Score 0  Altered sleeping 0  Tired, decreased energy 3  Change in appetite 1  Feeling bad or failure about yourself  0  Trouble concentrating 1  Moving slowly or fidgety/restless 0  Suicidal thoughts 0  PHQ-9 Score 5        09/15/2023    2:35 PM  GAD 7 : Generalized Anxiety Score  Nervous, Anxious, on Edge 0  Control/stop worrying 0  Worry too much - different things 0  Trouble relaxing 0  Restless 0  Easily annoyed or irritable 0  Afraid - awful might happen 2  Total GAD 7 Score 2     Review of Systems:   Pertinent items are noted in HPI Denies cramping/contractions, leakage of fluid, vaginal bleeding, abnormal vaginal discharge w/ itching/odor/irritation, headaches, visual changes, shortness of breath, chest pain, abdominal pain, severe nausea/vomiting, or problems with urination or bowel movements unless otherwise stated above.  Pertinent History Reviewed:  Reviewed past medical,surgical, social, obstetrical and family history.  Reviewed problem list, medications and allergies. OB History  Gravida Para Term Preterm AB Living  4 2 2  1 2   SAB IAB Ectopic Multiple Live Births  1    2    # Outcome  Date GA Lbr Len/2nd Weight Sex Type Anes PTL Lv  4 Current           3 SAB 01/2023          2 Term 07/24/21 [redacted]w[redacted]d  8 lb 7.1 oz (3.83 kg)  Vag-Spont EPI  LIV  1 Term 10/27/19 [redacted]w[redacted]d  7 lb 11 oz (3.487 kg) F Vag-Spont EPI  LIV   Physical Assessment:   Vitals:   09/15/23 1431  BP: 105/69  Pulse: 83  Weight: 156 lb (70.8 kg)  Body mass index is 27.63 kg/m.       Physical Examination:  General appearance - well appearing, and in no distress  Mental status - alert, oriented to person, place, and time  Psych:  She has a normal mood and affect  Skin - warm and dry, normal color, no suspicious lesions noted  Chest - effort normal, all lung fields clear to auscultation bilaterally  Heart - normal rate and regular rhythm  Abdomen - soft, nontender  Extremities:  No swelling or varicosities noted  Pelvic - VULVA: normal appearing vulva with no masses, tenderness or lesions  VAGINA: normal appearing vagina with normal color and discharge, no lesions  CERVIX: normal appearing cervix without discharge or lesions, no CMT  Thin prep pap is done w/ reflex HR HPV cotesting  Chaperone: Aleck Blase  TODAY'S  informal TA u/s +FHR and active fetus  No  results found for this or any previous visit (from the past 24 hours).  Assessment & Plan:  1) Low-Risk Pregnancy [id number] at [redacted]w[redacted]d with an Estimated Date of Delivery: 03/26/24   2) Initial OB visit  3) Chiari malformation> no problems  4) Dep/anx> no meds in doing well, IBH referral  Meds:  Meds ordered this encounter  Medications   Blood Pressure Monitor MISC    Sig: For regular home bp monitoring during pregnancy    Dispense:  1 each    Refill:  0    Z34.81 Please mail to patient    Initial labs obtained Continue prenatal vitamins Reviewed n/v relief measures and warning s/s to report Reviewed recommended weight gain based on pre-gravid BMI Encouraged well-balanced diet Genetic & carrier screening discussed: requests  Panorama, AFP, and Horizon , NT/IT currently unavailable Ultrasound discussed; fetal survey: requested [facility]NC completed> form faxed if has or is planning to apply for medicaid The nature of [place] [facility] [employer] with multiple MDs and other Advanced Practice Providers was explained to patient; also emphasized that fellows, residents, and students are part of our team. Does not have home bp cuff. Office bp cuff given: no. Rx sent: yes. Check bp weekly, let us  know if consistently >140/90.   Follow-up: Return in about 4 weeks (around 10/13/2023) for LROB, AFP, CNM in person; then 8wks from now anatomy u/s and LROB w/ CNM.   Orders Placed This Encounter  Procedures   Urine Culture   CBC/D/Plt+RPR+Rh+ABO+RubIgG...   PANORAMA PRENATAL TEST   HORIZON CUSTOM   Hemoglobin A1c   Amb ref to [facility]    Suzen JONELLE Fetters CNM, [facility] 09/15/2023 2:57 PM

## 2023-09-16 ENCOUNTER — Ambulatory Visit: Payer: Self-pay | Admitting: Women's Health

## 2023-09-16 DIAGNOSIS — Z2839 Other underimmunization status: Secondary | ICD-10-CM | POA: Insufficient documentation

## 2023-09-16 DIAGNOSIS — Z3480 Encounter for supervision of other normal pregnancy, unspecified trimester: Secondary | ICD-10-CM

## 2023-09-16 LAB — CBC/D/PLT+RPR+RH+ABO+RUBIGG...
Antibody Screen: NEGATIVE
Basophils Absolute: 0 x10E3/uL (ref 0.0–0.2)
Basos: 0 %
EOS (ABSOLUTE): 0.2 x10E3/uL (ref 0.0–0.4)
Eos: 2 %
HCV Ab: NONREACTIVE
HIV Screen 4th Generation wRfx: NONREACTIVE
Hematocrit: 38.1 % (ref 34.0–46.6)
Hemoglobin: 12.9 g/dL (ref 11.1–15.9)
Hepatitis B Surface Ag: NEGATIVE
Immature Grans (Abs): 0 x10E3/uL (ref 0.0–0.1)
Immature Granulocytes: 0 %
Lymphocytes Absolute: 3.2 x10E3/uL — ABNORMAL HIGH (ref 0.7–3.1)
Lymphs: 29 %
MCH: 29.9 pg (ref 26.6–33.0)
MCHC: 33.9 g/dL (ref 31.5–35.7)
MCV: 88 fL (ref 79–97)
Monocytes Absolute: 0.6 x10E3/uL (ref 0.1–0.9)
Monocytes: 6 %
Neutrophils Absolute: 7 x10E3/uL (ref 1.4–7.0)
Neutrophils: 63 %
Platelets: 241 x10E3/uL (ref 150–450)
RBC: 4.31 x10E6/uL (ref 3.77–5.28)
RDW: 13.2 % (ref 11.7–15.4)
RPR Ser Ql: NONREACTIVE
Rh Factor: NEGATIVE
Rubella Antibodies, IGG: <0.9 {index} — ABNORMAL LOW (ref >0.99)
WBC: 11.1 x10E3/uL — ABNORMAL HIGH (ref 3.4–10.8)

## 2023-09-16 LAB — HCV INTERPRETATION

## 2023-09-16 LAB — HEMOGLOBIN A1C
Est. average glucose Bld gHb Est-mCnc: 97 mg/dL
Hgb A1c MFr Bld: 5 % (ref 4.8–5.6)

## 2023-09-17 LAB — URINE CULTURE: Organism ID, Bacteria: NO GROWTH

## 2023-09-22 LAB — CYTOLOGY - PAP
Chlamydia: NEGATIVE
Comment: NEGATIVE
Comment: NEGATIVE
Comment: NORMAL
Diagnosis: NEGATIVE
High risk HPV: NEGATIVE
Neisseria Gonorrhea: NEGATIVE

## 2023-09-23 LAB — HORIZON CUSTOM: REPORT SUMMARY: NEGATIVE

## 2023-10-01 ENCOUNTER — Telehealth: Payer: Self-pay | Admitting: Clinical

## 2023-10-01 NOTE — Telephone Encounter (Signed)
Attempt call regarding referral; Left HIPPA-compliant message to call back Mikle Sternberg from [facility] at  [phone] (Ashelyn Mccravy's office).    

## 2023-10-02 ENCOUNTER — Telehealth: Payer: Self-pay | Admitting: Women's Health

## 2023-10-02 NOTE — Telephone Encounter (Signed)
 Patient just called wanting to speak so someone in regards to her sugar states that she has been having dizzy spells and light headed, wants someone to call her

## 2023-10-13 ENCOUNTER — Encounter: Payer: Self-pay | Admitting: Women's Health

## 2023-10-13 ENCOUNTER — Ambulatory Visit: Admitting: Women's Health

## 2023-10-13 VITALS — BP 101/63 | HR 64 | Wt 164.0 lb

## 2023-10-13 DIAGNOSIS — Z3A16 16 weeks gestation of pregnancy: Secondary | ICD-10-CM

## 2023-10-13 DIAGNOSIS — Z3480 Encounter for supervision of other normal pregnancy, unspecified trimester: Secondary | ICD-10-CM

## 2023-10-13 DIAGNOSIS — Z363 Encounter for antenatal screening for malformations: Secondary | ICD-10-CM

## 2023-10-13 DIAGNOSIS — Z3482 Encounter for supervision of other normal pregnancy, second trimester: Secondary | ICD-10-CM | POA: Diagnosis not present

## 2023-10-13 DIAGNOSIS — Z1379 Encounter for other screening for genetic and chromosomal anomalies: Secondary | ICD-10-CM | POA: Diagnosis not present

## 2023-10-13 NOTE — Progress Notes (Signed)
    LOW-RISK PREGNANCY VISIT Patient name: Latasha Peters MRN [id number]  Date of birth: 2000-11-29 Chief Complaint:   Routine Prenatal Visit (AFP)  History of Present Illness:   Latasha Peters is a 23 y.o. [phone] female at [redacted]w[redacted]d with an Estimated Date of Delivery: 03/26/24 being seen today for ongoing management of a low-risk pregnancy.   Today she reports no complaints.  .  .  Movement: Absent. denies leaking of fluid.     09/15/2023    2:35 PM  Depression screen PHQ 2/9  Decreased Interest 0  Down, Depressed, Hopeless 0  PHQ - 2 Score 0  Altered sleeping 0  Tired, decreased energy 3  Change in appetite 1  Feeling bad or failure about yourself  0  Trouble concentrating 1  Moving slowly or fidgety/restless 0  Suicidal thoughts 0  PHQ-9 Score 5        09/15/2023    2:35 PM  GAD 7 : Generalized Anxiety Score  Nervous, Anxious, on Edge 0  Control/stop worrying 0  Worry too much - different things 0  Trouble relaxing 0  Restless 0  Easily annoyed or irritable 0  Afraid - awful might happen 2  Total GAD 7 Score 2      Review of Systems:   Pertinent items are noted in HPI Denies abnormal vaginal discharge w/ itching/odor/irritation, headaches, visual changes, shortness of breath, chest pain, abdominal pain, severe nausea/vomiting, or problems with urination or bowel movements unless otherwise stated above. Pertinent History Reviewed:  Reviewed past medical,surgical, social, obstetrical and family history.  Reviewed problem list, medications and allergies. Physical Assessment:   Vitals:   10/13/23 1342  BP: 101/63  Pulse: 64  Weight: 164 lb (74.4 kg)  Body mass index is 29.05 kg/m.        Physical Examination:   General appearance: Well appearing, and in no distress  Mental status: Alert, oriented to person, place, and time  Skin: Warm & dry  Cardiovascular: Normal heart rate noted  Respiratory: Normal respiratory effort, no distress  Abdomen: Soft, gravid,  nontender  Pelvic: Cervical exam deferred         Extremities: Edema: None  Fetal Status: Fetal Heart Rate (bpm): 158   Movement: Absent    Chaperone: N/A No results found for this or any previous visit (from the past 24 hours).  Assessment & Plan:  1) Low-risk pregnancy [id number] at [redacted]w[redacted]d with an Estimated Date of Delivery: 03/26/24   Meds: No orders of the defined types were placed in this encounter.  Labs/procedures today: AFP  Plan:  Continue routine obstetrical care  Next visit: prefers will be in person for u/s    Reviewed: Preterm labor symptoms and general obstetric precautions including but not limited to vaginal bleeding, contractions, leaking of fluid and fetal movement were reviewed in detail with the patient.  All questions were answered. Does have home bp cuff. Office bp cuff given: not applicable. Check bp weekly, let us  know if consistently >140 and/or >90.  Follow-up: No follow-ups on file.  Future Appointments  Date Time Provider Department Center  11/10/2023  8:30 AM [facility] US  [facility] None  11/10/2023  9:30 AM Kizzie Suzen SAUNDERS, CNM [facility]    Orders Placed This Encounter  Procedures   US  OB Comp + 14 Wk   AFP, Serum, Open Spina Bifida   Suzen SAUNDERS Kizzie CNM, [facility] 10/13/2023 2:11 PM

## 2023-10-16 ENCOUNTER — Ambulatory Visit: Payer: Self-pay | Admitting: Women's Health

## 2023-10-16 DIAGNOSIS — Z3480 Encounter for supervision of other normal pregnancy, unspecified trimester: Secondary | ICD-10-CM

## 2023-10-16 LAB — AFP, SERUM, OPEN SPINA BIFIDA
AFP MoM: 1.24
AFP Value: 38.8 ng/mL
Gest. Age on Collection Date: 16 wk
Maternal Age At EDD: 23.4 a
OSBR Risk 1 IN: 5748
Test Results:: NEGATIVE
Weight: 164 [lb_av]

## 2023-11-07 ENCOUNTER — Telehealth: Payer: Self-pay | Admitting: Clinical

## 2023-11-07 NOTE — Telephone Encounter (Signed)
Attempt call regarding referral; Left HIPPA-compliant message to call back Mikle Sternberg from [facility] at  [phone] (Ashelyn Mccravy's office).    

## 2023-11-10 ENCOUNTER — Encounter: Payer: Self-pay | Admitting: Women's Health

## 2023-11-10 ENCOUNTER — Ambulatory Visit: Admitting: Women's Health

## 2023-11-10 ENCOUNTER — Ambulatory Visit

## 2023-11-10 VITALS — BP 111/73 | HR 69 | Wt 165.0 lb

## 2023-11-10 DIAGNOSIS — Z3A20 20 weeks gestation of pregnancy: Secondary | ICD-10-CM

## 2023-11-10 DIAGNOSIS — Z3482 Encounter for supervision of other normal pregnancy, second trimester: Secondary | ICD-10-CM | POA: Diagnosis not present

## 2023-11-10 DIAGNOSIS — Z364 Encounter for antenatal screening for fetal growth retardation: Secondary | ICD-10-CM | POA: Diagnosis not present

## 2023-11-10 DIAGNOSIS — Z3480 Encounter for supervision of other normal pregnancy, unspecified trimester: Secondary | ICD-10-CM

## 2023-11-10 DIAGNOSIS — Z363 Encounter for antenatal screening for malformations: Secondary | ICD-10-CM | POA: Diagnosis not present

## 2023-11-10 NOTE — Patient Instructions (Signed)
Latasha Peters, thank you for choosing our office today! We appreciate the opportunity to meet your healthcare needs. You may receive a short survey by mail, e-mail, or through [employer]. If you are happy with your care we would appreciate if you could take just a few minutes to complete the survey questions. We read all of your comments and take your feedback very seriously. Thank you again for choosing our office.  Center for [employer] Team at [facility] at [facility] ([address] [place], [place] [address]) Entrance C, located off of [address] Free 24/7 valet parking  Go to [email or web address] to register for FREE online childbirth classes  Call the office [phone]) or go to [facility] if: You begin to severe cramping Your water breaks.  Sometimes it is a big gush of fluid, sometimes it is just a trickle that keeps getting your panties wet or running down your legs You have vaginal bleeding.  It is normal to have a small amount of spotting if your cervix was checked.   [facility] Pediatricians/Family Doctors [facility]): [address] Colette Ribas, [phone]           [facility]: [address], [phone]                [facility]): [address], [phone] (call to ask if accepting patients) [facility] Department: [address], [place], [phone]    [facility] Pediatricians/Family Doctors [facility]): [phone], [phone] [facility]: [address] [place], [phone] [facility]: [address], [phone]  [facility] Doctors  [facility]): [phone] [facility]: [address] [phone]   [facility] Doctors [facility]: [address], [phone]  [facility] Doctors  [facility]: [phone], [phone]  Home Blood Pressure Monitoring for Patients   Your provider has recommended that you check your blood pressure (BP) at least once a week at home. If you do not have a blood pressure cuff at home, one will be provided for you. Contact your provider if you have not received your monitor within 1 week.   Helpful Tips for Accurate Home Blood Pressure Checks  Don't smoke, exercise, or drink caffeine 30 minutes before checking your BP Use the restroom before checking your BP (a full bladder can raise your pressure) Relax in a comfortable upright chair Feet on the ground Left arm resting comfortably on a flat surface at the level of your heart Legs uncrossed Back supported Sit quietly and don't talk Place the cuff on your bare arm Adjust snuggly, so that only two fingertips can fit between your skin and the top of the cuff Check 2 readings separated by at least one minute Keep a log of your BP readings For a visual, please reference this diagram: http://ccnc.care/bpdiagram  Provider Name: [facility]     Phone: [phone]  Zone 1: ALL CLEAR  Continue to monitor your symptoms:  BP reading is less than 140 (top number) or less than 90 (bottom number)  No right upper stomach pain No headaches or seeing spots No feeling nauseated or throwing up No swelling in face and hands  Zone 2: CAUTION Call your doctor's office for any of the following:  BP reading is greater than 140 (top number) or greater than  90 (bottom number)  Stomach pain under your ribs in the middle or right side Headaches or seeing spots Feeling nauseated or throwing up Swelling in face and hands  Zone 3: EMERGENCY  Seek immediate medical care if you have any of the following:  BP reading is greater than160 (top number) or greater than 110 (bottom number) Severe headaches not improving with Tylenol Serious difficulty catching your breath Any worsening symptoms from  Zone 2     Second Trimester of Pregnancy The second trimester is from week 14 through week 27 (months 4 through 6). The second trimester is often a time when you feel your best. Your body has adjusted to being pregnant, and you begin to feel better physically. Usually, morning sickness has lessened or quit completely, you may have more energy, and you may have an increase in appetite. The second trimester is also a time when the fetus is growing rapidly. At the end of the sixth month, the fetus is about 9 inches long and weighs about 1 pounds. You will likely begin to feel the baby move (quickening) between 16 and 20 weeks of pregnancy. Body changes during your second trimester Your body continues to go through many changes during your second trimester. The changes vary from woman to woman. Your weight will continue to increase. You will notice your lower abdomen bulging out. You may begin to get stretch marks on your hips, abdomen, and breasts. You may develop headaches that can be relieved by medicines. The medicines should be approved by your health care provider. You may urinate more often because the fetus is pressing on your bladder. You may develop or continue to have heartburn as a result of your pregnancy. You may develop constipation because certain hormones are causing the muscles that push waste through your intestines to slow down. You may develop hemorrhoids or swollen, bulging veins (varicose veins). You may have back pain. This is caused by: Weight gain. Pregnancy hormones that are relaxing the joints in your pelvis. A shift in weight and the muscles that support your balance. Your breasts will continue to grow and they will continue to become tender. Your gums may bleed and may be sensitive to brushing and flossing. Dark spots or blotches (chloasma, mask of pregnancy) may develop on your face. This will likely fade after the baby is born. A dark line from your belly button to  the pubic area (linea nigra) may appear. This will likely fade after the baby is born. You may have changes in your hair. These can include thickening of your hair, rapid growth, and changes in texture. Some women also have hair loss during or after pregnancy, or hair that feels dry or thin. Your hair will most likely return to normal after your baby is born.  What to expect at prenatal visits During a routine prenatal visit: You will be weighed to make sure you and the fetus are growing normally. Your blood pressure will be taken. Your abdomen will be measured to track your baby's growth. The fetal heartbeat will be listened to. Any test results from the previous visit will be discussed.  Your health care provider may ask you: How you are feeling. If you are feeling the baby move. If you have had any abnormal symptoms, such as leaking fluid, bleeding, severe headaches, or abdominal cramping. If you are using any tobacco products, including cigarettes, chewing tobacco, and electronic cigarettes. If you have any questions.  Other tests that may be performed during   your second trimester include: Blood tests that check for: Low iron levels (anemia). High blood sugar that affects pregnant women (gestational diabetes) between 24 and 28 weeks. Rh antibodies. This is to check for a protein on red blood cells (Rh factor). Urine tests to check for infections, diabetes, or protein in the urine. An ultrasound to confirm the proper growth and development of the baby. An amniocentesis to check for possible genetic problems. Fetal screens for spina bifida and Down syndrome. HIV (human immunodeficiency virus) testing. Routine prenatal testing includes screening for HIV, unless you choose not to have this test.  Follow these instructions at home: Medicines Follow your health care provider's instructions regarding medicine use. Specific medicines may be either safe or unsafe to take during  pregnancy. Take a prenatal vitamin that contains at least 600 micrograms (mcg) of folic acid. If you develop constipation, try taking a stool softener if your health care provider approves. Eating and drinking Eat a balanced diet that includes fresh fruits and vegetables, whole grains, good sources of protein such as meat, eggs, or tofu, and low-fat dairy. Your health care provider will help you determine the amount of weight gain that is right for you. Avoid raw meat and uncooked cheese. These carry germs that can cause birth defects in the baby. If you have low calcium intake from food, talk to your health care provider about whether you should take a daily calcium supplement. Limit foods that are high in fat and processed sugars, such as fried and sweet foods. To prevent constipation: Drink enough fluid to keep your urine clear or pale yellow. Eat foods that are high in fiber, such as fresh fruits and vegetables, whole grains, and beans. Activity Exercise only as directed by your health care provider. Most women can continue their usual exercise routine during pregnancy. Try to exercise for 30 minutes at least 5 days a week. Stop exercising if you experience uterine contractions. Avoid heavy lifting, wear low heel shoes, and practice good posture. A sexual relationship may be continued unless your health care provider directs you otherwise. Relieving pain and discomfort Wear a good support bra to prevent discomfort from breast tenderness. Take warm sitz baths to soothe any pain or discomfort caused by hemorrhoids. Use hemorrhoid cream if your health care provider approves. Rest with your legs elevated if you have leg cramps or low back pain. If you develop varicose veins, wear support hose. Elevate your feet for 15 minutes, 3-4 times a day. Limit salt in your diet. Prenatal Care Write down your questions. Take them to your prenatal visits. Keep all your prenatal visits as told by your health  care provider. This is important. Safety Wear your seat belt at all times when driving. Make a list of emergency phone numbers, including numbers for family, friends, the hospital, and police and fire departments. General instructions Ask your health care provider for a referral to a local prenatal education class. Begin classes no later than the beginning of month 6 of your pregnancy. Ask for help if you have counseling or nutritional needs during pregnancy. Your health care provider can offer advice or refer you to specialists for help with various needs. Do not use hot tubs, steam rooms, or saunas. Do not douche or use tampons or scented sanitary pads. Do not cross your legs for long periods of time. Avoid cat litter boxes and soil used by cats. These carry germs that can cause birth defects in the baby and possibly loss of the   fetus by miscarriage or stillbirth. Avoid all smoking, herbs, alcohol, and unprescribed drugs. Chemicals in these products can affect the formation and growth of the baby. Do not use any products that contain nicotine or tobacco, such as cigarettes and e-cigarettes. If you need help quitting, ask your health care provider. Visit your dentist if you have not gone yet during your pregnancy. Use a soft toothbrush to brush your teeth and be gentle when you floss. Contact a health care provider if: You have dizziness. You have mild pelvic cramps, pelvic pressure, or nagging pain in the abdominal area. You have persistent nausea, vomiting, or diarrhea. You have a bad smelling vaginal discharge. You have pain when you urinate. Get help right away if: You have a fever. You are leaking fluid from your vagina. You have spotting or bleeding from your vagina. You have severe abdominal cramping or pain. You have rapid weight gain or weight loss. You have shortness of breath with chest pain. You notice sudden or extreme swelling of your face, hands, ankles, feet, or legs. You  have not felt your baby move in over an hour. You have severe headaches that do not go away when you take medicine. You have vision changes. Summary The second trimester is from week 14 through week 27 (months 4 through 6). It is also a time when the fetus is growing rapidly. Your body goes through many changes during pregnancy. The changes vary from woman to woman. Avoid all smoking, herbs, alcohol, and unprescribed drugs. These chemicals affect the formation and growth your baby. Do not use any tobacco products, such as cigarettes, chewing tobacco, and e-cigarettes. If you need help quitting, ask your health care provider. Contact your health care provider if you have any questions. Keep all prenatal visits as told by your health care provider. This is important. This information is not intended to replace advice given to you by your health care provider. Make sure you discuss any questions you have with your health care provider. Document Released: 02/12/2001 Document Revised: 07/27/2015 Document Reviewed: 04/21/2012 Elsevier Interactive Patient Education  2017 Elsevier Inc.  

## 2023-11-10 NOTE — Progress Notes (Signed)
 LOW-RISK PREGNANCY VISIT Patient name: Latasha Peters MRN [id number]  Date of birth: November 05, 2000 Chief Complaint:   Routine Prenatal Visit  History of Present Illness:   Latasha Peters is a 23 y.o. H5E7987 female at [redacted]w[redacted]d with an Estimated Date of Delivery: 03/26/24 being seen today for ongoing management of a low-risk pregnancy.   Today she reports bad crampy pain last night, better this am. Contractions: Not present. Vag. Bleeding: None.  Movement: Present. denies leaking of fluid.     09/15/2023    2:35 PM  Depression screen PHQ 2/9  Decreased Interest 0  Down, Depressed, Hopeless 0  PHQ - 2 Score 0  Altered sleeping 0  Tired, decreased energy 3  Change in appetite 1  Feeling bad or failure about yourself  0  Trouble concentrating 1  Moving slowly or fidgety/restless 0  Suicidal thoughts 0  PHQ-9 Score 5        09/15/2023    2:35 PM  GAD 7 : Generalized Anxiety Score  Nervous, Anxious, on Edge 0  Control/stop worrying 0  Worry too much - different things 0  Trouble relaxing 0  Restless 0  Easily annoyed or irritable 0  Afraid - awful might happen 2  Total GAD 7 Score 2      Review of Systems:   Pertinent items are noted in HPI Denies abnormal vaginal discharge w/ itching/odor/irritation, headaches, visual changes, shortness of breath, chest pain, abdominal pain, severe nausea/vomiting, or problems with urination or bowel movements unless otherwise stated above. Pertinent History Reviewed:  Reviewed past medical,surgical, social, obstetrical and family history.  Reviewed problem list, medications and allergies. Physical Assessment:   Vitals:   11/10/23 0938  BP: 111/73  Pulse: 69  Weight: 165 lb (74.8 kg)  Body mass index is 29.23 kg/m.        Physical Examination:   General appearance: Well appearing, and in no distress  Mental status: Alert, oriented to person, place, and time  Skin: Warm & dry  Cardiovascular: Normal heart rate noted  Respiratory:  Normal respiratory effort, no distress  Abdomen: Soft, gravid, nontender  Pelvic: Cervical exam deferred         Extremities:    Fetal Status:     Movement: Present  US  20+3 wks,cephalic,anterior placenta gr 0,CX 2.8 cm,normal ovaries,SVP of fluid 4.6 cm,FHR 127 bpm,EFW 299 g 10%,AC 26%,HC 8%,FL 10%,anatomy complete   Chaperone: N/A No results found for this or any previous visit (from the past 24 hours).  Assessment & Plan:  1) Low-risk pregnancy [id number] at [redacted]w[redacted]d with an Estimated Date of Delivery: 03/26/24   2) Borderline FGR, 10% today (AC 26%), stopped smoking 3d ago! Will repeat EFW next visit   Meds: No orders of the defined types were placed in this encounter.  Labs/procedures today: U/S  Plan:  Continue routine obstetrical care  Next visit: prefers will be in person for u/s    Reviewed: Preterm labor symptoms and general obstetric precautions including but not limited to vaginal bleeding, contractions, leaking of fluid and fetal movement were reviewed in detail with the patient.  All questions were answered. Does have home bp cuff. Office bp cuff given: not applicable. Check bp weekly, let us  know if consistently >140 and/or >90.  Follow-up: Return in about 4 weeks (around 12/08/2023) for LROB, US :EFW, CNM, in person.  Future Appointments  Date Time Provider Department Center  11/20/2023  1:15 PM [facility] HEALTH CLINICIAN [facility]  12/08/2023 10:30 AM [facility]  IMG 2 [facility] None  12/08/2023 11:30 AM Delores Nidia CROME, FNP [facility]    Orders Placed This Encounter  Procedures   US  OB Follow Up   Suzen JONELLE Fetters CNM, [facility] 11/10/2023 9:56 AM

## 2023-11-10 NOTE — Progress Notes (Signed)
 US  20+3 wks,cephalic,anterior placenta gr 0,CX 2.8 cm,normal ovaries,SVP of fluid 4.6 cm,FHR 127 bpm,EFW 299 g 10%,AC 26%,HC 8%,FL 10%,anatomy complete

## 2023-11-12 ENCOUNTER — Other Ambulatory Visit: Payer: Self-pay | Admitting: Women's Health

## 2023-11-12 ENCOUNTER — Encounter: Payer: Self-pay | Admitting: Women's Health

## 2023-11-12 MED ORDER — CITRANATAL ASSURE 35-1 & 300 MG PO MISC: ORAL | 3 refills | Status: DC

## 2023-11-19 NOTE — BH Specialist Note (Unsigned)
 Integrated Behavioral Health via Telemedicine Visit  11/20/2023 Latasha Peters [id number]  Number of Integrated Behavioral Health Clinician visits: 1- Initial Visit  Session Start time: 1315   Session End time: 1359  Total time in minutes: 44   Referring Provider: Suzen Fetters, CNM Patient/Family location: [place] Provider location: Center for [facility] at [facility]  All persons participating in visit: Patient Latasha Peters and [facility] Amzie Sillas   Types of Service: Individual psychotherapy and Video visit  I connected with [name] and/or Shaundrea Starrett's n/a via  Telephone or Video Enabled Telemedicine Application  (Video is Caregility application) and verified that I am speaking with the correct person using two identifiers. Discussed confidentiality: Yes   I discussed the limitations of telemedicine and the availability of in person appointments.  Discussed there is a possibility of technology failure and discussed alternative modes of communication if that failure occurs.  I discussed that engaging in this telemedicine visit, they consent to the provision of behavioral healthcare and the services will be billed under their insurance.  Patient and/or legal guardian expressed understanding and consented to Telemedicine visit: Yes   Presenting Concerns: Patient and/or family reports the following symptoms/concerns: Managing ADHD with anxiety in pregnancy, following pregnancy loss in November; last panic attack over two weeks ago; difficulty falling asleep at times.  Pt has coped in the past with therapy and medication (Vyvanse, Zoloft, Lexapro, Wellbutrin); pt prefers non-pharmacologic coping strategies today.  Duration of problem: Ongoing with pregnancy increase; Severity of problem: moderate  Patient and/or Family's Strengths/Protective Factors: Sense of purpose  Goals Addressed: Patient will:  Reduce symptoms of: anxiety    Increase knowledge and/or ability of: healthy habits and self-management skills   Demonstrate ability to: Increase healthy adjustment to current life circumstances and Increase motivation to adhere to plan of care  Progress towards Goals: Ongoing  Interventions: Interventions utilized:  Mindfulness or Management consultant, Psychoeducation and/or Health Education, and Link to [employer] Standardized Assessments completed: Not Needed  Patient and/or Family Response: Patient agrees with treatment plan.  Clinical Assessment/Diagnosis  Attention deficit hyperactivity disorder (ADHD), unspecified ADHD type  Anxiety disorder, unspecified type    Patient may benefit from psychoeducation and brief therapeutic interventions regarding coping with symptoms of anxiety.  Plan: Follow up with behavioral health clinician on : Two weeks Behavioral recommendations:  -Continue taking prenatal vitamin as recommended -CALM relaxation breathing exercise twice daily (morning; at bedtime with sleep sounds and AC); as needed throughout the day. -Continue prioritizing healthy self-care (regular meals, adequate rest; allowing practical help from supportive friends and family, as needed) -Read through information on After Visit Summary; use as needed and discussed  Referral(s): Integrated Art gallery manager (In Clinic) and Community Resources:  Family Paws  I discussed the assessment and treatment plan with the patient and/or parent/guardian. They were provided an opportunity to ask questions and all were answered. They agreed with the plan and demonstrated an understanding of the instructions.   They were advised to call back or seek an in-person evaluation if the symptoms worsen or if the condition fails to improve as anticipated.  [name], LCSW     09/15/2023    2:35 PM  Depression screen PHQ 2/9  Decreased Interest 0  Down, Depressed, Hopeless 0  PHQ - 2 Score 0  Altered  sleeping 0  Tired, decreased energy 3  Change in appetite 1  Feeling bad or failure about yourself  0  Trouble concentrating 1  Moving slowly or fidgety/restless 0  Suicidal thoughts 0  PHQ-9 Score 5      09/15/2023    2:35 PM  GAD 7 : Generalized Anxiety Score  Nervous, Anxious, on Edge 0  Control/stop worrying 0  Worry too much - different things 0  Trouble relaxing 0  Restless 0  Easily annoyed or irritable 0  Afraid - awful might happen 2  Total GAD 7 Score 2

## 2023-11-20 ENCOUNTER — Telehealth: Payer: Self-pay | Admitting: Women's Health

## 2023-11-20 ENCOUNTER — Ambulatory Visit: Payer: Self-pay

## 2023-11-20 DIAGNOSIS — F419 Anxiety disorder, unspecified: Secondary | ICD-10-CM

## 2023-11-20 DIAGNOSIS — F909 Attention-deficit hyperactivity disorder, unspecified type: Secondary | ICD-10-CM | POA: Diagnosis not present

## 2023-11-20 MED ORDER — M-NATAL PLUS 27-1 MG PO TABS: ORAL_TABLET | ORAL | 3 refills | Status: DC

## 2023-11-20 NOTE — Addendum Note (Signed)
 Addended by: BERNARDO ALAN CROME on: 11/20/2023 03:23 PM   Modules accepted: Orders

## 2023-11-20 NOTE — Patient Instructions (Signed)
 Center for [facility] Healthcare at [facility] [address] [place], [place] [address] [phone] (main office) [phone] [facility] office)   [email or web address]  Family Paws: Creating Dog Aware Generations familypaws.com  /Emotional Wellbeing Apps and Websites Here are a few free apps meant to help you to help yourself.  To find, try searching on the internet to see if the app is offered on Apple/Android devices. If your first choice doesn't come up on your device, the good news is that there are many choices! Play around with different apps to see which ones are helpful to you.    Calm This is an app meant to help increase calm feelings. Includes info, strategies, and tools for tracking your feelings.      Calm Harm  This app is meant to help with self-harm. Provides many 5-minute or 15-min coping strategies for doing instead of hurting yourself.       Healthy Minds Health Minds is a problem-solving tool to help deal with emotions and cope with stress you encounter wherever you are.      MindShift This app can help people cope with anxiety. Rather than trying to avoid anxiety, you can make an important shift and face it.      MY3  MY3 features a support system, safety plan and resources with the goal of offering a tool to use in a time of need.       My Life My Voice  This mood journal offers a simple solution for tracking your thoughts, feelings and moods. Animated emoticons can help identify your mood.       Relax Melodies Designed to help with sleep, on this app you can mix sounds and meditations for relaxation.      Smiling Mind Smiling Mind is meditation made easy: it's a simple tool that helps put a smile on your mind.        Stop, Breathe & Think  A friendly, simple guide for people through meditations for mindfulness and compassion.  Stop, Breathe and Think Kids Enter your current feelings and choose a "mission" to help  you cope. Offers videos for certain moods instead of just sound recordings.       Team Orange The goal of this tool is to help teens change how they think, act, and react. This app helps you focus on your own good feelings and experiences.      The [employer] Box The [employer] Box (VHB) contains simple tools to help patients with coping, relaxation, distraction, and positive thinking.

## 2023-11-20 NOTE — Telephone Encounter (Signed)
 Patient called to let us  know that she uses the [facility] in [place]. Could we resend her prenatal rx to that pharmacy?

## 2023-11-20 NOTE — Telephone Encounter (Signed)
 [facility].

## 2023-11-27 ENCOUNTER — Encounter: Payer: Self-pay | Admitting: Women's Health

## 2023-11-28 ENCOUNTER — Other Ambulatory Visit: Payer: Self-pay

## 2023-11-28 DIAGNOSIS — Z3A23 23 weeks gestation of pregnancy: Secondary | ICD-10-CM

## 2023-11-28 DIAGNOSIS — Z3481 Encounter for supervision of other normal pregnancy, first trimester: Secondary | ICD-10-CM

## 2023-11-28 MED ORDER — M-NATAL PLUS 27-1 MG PO TABS: ORAL_TABLET | ORAL | 3 refills | Status: AC

## 2023-12-01 ENCOUNTER — Encounter: Payer: Self-pay | Admitting: Women's Health

## 2023-12-08 ENCOUNTER — Encounter: Admitting: Obstetrics and Gynecology

## 2023-12-08 ENCOUNTER — Other Ambulatory Visit: Admitting: Radiology

## 2023-12-09 ENCOUNTER — Ambulatory Visit (INDEPENDENT_AMBULATORY_CARE_PROVIDER_SITE_OTHER): Admitting: Clinical

## 2023-12-09 DIAGNOSIS — F419 Anxiety disorder, unspecified: Secondary | ICD-10-CM

## 2023-12-09 DIAGNOSIS — F909 Attention-deficit hyperactivity disorder, unspecified type: Secondary | ICD-10-CM

## 2023-12-09 NOTE — BH Specialist Note (Signed)
 Integrated Behavioral Health via Telemedicine Visit  12/09/2023 Lyzbeth Genrich [id number]  Number of Integrated Behavioral Health Clinician visits: 2- Second Visit  Session Start time: 1347   Session End time: 1419  Total time in minutes: 32  Referring Provider: Suzen Fetters, CNM Patient/Family location: [facility] Provider location: [facility]  All persons participating in visit: Patient Latasha Peters and [facility] Kylene Zamarron   Types of Service: Individual psychotherapy and Video visit  I connected with [name] and/or Gaye Rostro's n/a via  Telephone or Video Enabled Telemedicine Application  (Video is Caregility application) and verified that I am speaking with the correct person using two identifiers. Discussed confidentiality: Yes   I discussed the limitations of telemedicine and the availability of in person appointments.  Discussed there is a possibility of technology failure and discussed alternative modes of communication if that failure occurs.  I discussed that engaging in this telemedicine visit, they consent to the provision of behavioral healthcare and the services will be billed under their insurance.  Patient and/or legal guardian expressed understanding and consented to Telemedicine visit: Yes   Presenting Concerns: Patient and/or family reports the following symptoms/concerns: Feeling numb, like I can't cry recently; partially attributed to decision to break up with FOB over not adhering to agreed-upon boundaries in the relationship, as well as the stress of having to move/financial stress. 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  Duration of problem: recent updated concern; ongoing; Severity of problem: moderate  Patient and/or Family's  Strengths/Protective Factors: Social connections and Sense of purpose  Goals Addressed: Patient will:  Reduce symptoms of: anxiety and stress   Increase knowledge and/or ability of: stress reduction   Demonstrate ability to: Increase healthy adjustment to current life circumstances and Increase motivation to adhere to plan of care  Progress towards Goals: Ongoing    Interventions: Interventions utilized:  Motivational Interviewing, Link to [facility], and Supportive Reflection Standardized Assessments completed: Not Needed  Patient and/or Family Response: Patient agrees with treatment plan.  Clinical Assessment/Diagnosis  Anxiety disorder, unspecified type  Attention deficit hyperactivity disorder (ADHD), unspecified ADHD type   Patient may benefit from continued therapeutic intervention  .  Plan: Follow up with behavioral health clinician on : Two weeks Behavioral recommendations:  -Continue taking prenatal vitamin as recommended -Continue healthy self-care and self-coping (relaxation breathing, regular meals, adequate rest; time with supportive people in life) --Read through information on After Visit Summary; use as needed and discussed (community resources for post-move)  Referral(s): Integrated Art gallery manager (In Clinic) and [facility]:  Housing, Transportation, and childcare  I discussed the assessment and treatment plan with the patient and/or parent/guardian. They were provided an opportunity to ask questions and all were answered. They agreed with the plan and demonstrated an understanding of the instructions.   They were advised to call back or seek an in-person evaluation if the symptoms worsen or if the condition fails to improve as anticipated.  Warren BROCKS Kalianne Fetting, LCSW     09/15/2023    2:35 PM  Depression screen PHQ 2/9  Decreased Interest 0  Down, Depressed, Hopeless 0  PHQ - 2 Score 0  Altered sleeping 0  Tired, decreased  energy 3  Change in appetite 1  Feeling bad or failure about yourself  0  Trouble  concentrating 1  Moving slowly or fidgety/restless 0  Suicidal thoughts 0  PHQ-9 Score 5      09/15/2023    2:35 PM  GAD 7 : Generalized Anxiety Score  Nervous, Anxious, on Edge 0  Control/stop worrying 0  Worry too much - different things 0  Trouble relaxing 0  Restless 0  Easily annoyed or irritable 0  Afraid - awful might happen 2  Total GAD 7 Score 2

## 2023-12-09 NOTE — Patient Instructions (Addendum)
 Center for [facility] at [facility] for Women [address] [place], [place] [address] [phone] (main office) [phone] (Gunda Maqueda's office)  [place] Child Counsellor  (Childcare options, Early childcare development, etc.) www.gu[place]children.org  [place]  Child Care Facility Search Engine  https://ncchildcare.http://cook.com/  Transportation Resources [place] [employer] (GTA) [address]  [address] [place], [place], [place] [address] https://www.[place].gov/departments/transportation/gdot-divisions/[place]-transit-agency-public-transportation-division     Fixed-route bus services, including regional fare cards for PART, [place], [place], and WSTA buses.  Reduced fare bus ID's available for Medicaid, Medicare, and orange card recipients.  SCAT offers curb-to-curb and door-to-door bus services for people with disabilities who are unable to use a fixed-bus route; also offers a shared-ride program.   Helpful tips:  -Routes available online and physical maps available at the main bus hub lobby (each for a specific route) -Smartphone directions often include bus routes (see the bus icon, next to the car and walk icons) -Routes differ on weekends, evenings and holidays, so plan ahead!  -If you have Medicaid, Medicare, or orange card, plan to obtain a reduced-fare ID to save 50% on rides. Check days and times to obtain an ID, and bring all necessary documents.   [employer] System [place]) [address] [place], [place]. [address], [place], [place] [address] [phone] SpotApps.nl **Fixed-bus route services, and demand response bus service for older adults  Department of Social [facility] [address] [place], [place] [address] [phone] www.MysterySinger.com.cy  [employer]PL Corporation [address] [place] [address], [place], [place] [address] www.cjmedicaltransportation.com  ** Offers non-emergency transportation for medical appointments  Wheels [address] [place], [place] [address] [phone] www.wheels4hope.org **REFERRAL NEEDED by specific agencies (see website), after meeting specified criteria only  [employer] for Humana [employer]) [address] [place], [place] [address] [phone]  BuyingShow.es  *Regional fixed-bus routes between counties (example: [place] to [place]) and [employer]ur[employer] of [place]  [address], [place], [place] [address] [phone] Http://senior-resources-[place]il[place].org  Museum/gallery exhibitions officer available (call or see website for details)  Senior Adults Association-[place] [facility] [address], [place], [place] [address] [phone] www.senioradults.org  **Ride coordination    Transportation Services [facility]  Aging, Disability and Transit [facility] [address], [place], [place] [address] [phone] www.adtsrc.[facility] Department of Health and [facility] [address], [place], [place] [address] [phone]  FutureSponsors.be  **Transportation expense assistance  Department of Social [place] [address], [place], [place] [address] [phone] www.co.[place].Ludowici.us angus.aspx?id=[email or web address]&catid=407  **Medicaid transportation for recipients who need assistance getting to [facility] medical appointments and providers      Housing Resources                    [employer] (serves [place], [place], [place], [place], [place], [place], [place], [place], [place], [place], [place], [place], and [place] counties) [address], [place], [place] [address] [phone] DeveloperU.ch  **Rental assistance, Home Rehabilitation,Weatherization  Assistance Program, Chief Financial Officer, Housing Voucher Program  Continental Airlines for Housing and MetLife Studies: Proofreader Resources to residents of [place], [place], and [place] Make sure you have your documents ready, including:  (Household income verification: 2 months pay stubs, unemployment/social security award letter, statement of no income for all household members over 73) Photo ID for all household members over 18 Utility Bill/Rent Ledger/Lease: must show past due amount for utilities/rent, or the rental agreement if rent  is current 2. Start your application online or by paper (in [place] or [place]) at:     https://www.castaneda.biz/  3. Once you have completed the online application, you will get an email confirmation message from the county. Expect to hear back by phone or email at least 6-10 weeks from submitting your application.  4. While you wait:  Call [phone] to check in on your application Let your landlord know that you've applied. Your landlord will be asked to submit documents (W-9) during this application process. Payments will be made directly to the landlord/property management company and utility company Rent or utility assistance for [employer], [place], and [place] Apply at https://rb.gy/dvxbfv Questions? Call or email Renee at [phone] or [email or web address]   Eviction Mediation Program: The HOPE Program Https://www.rebuild.BedroomRental.com.cy HOPE Progam serves low-income renters in 78 [place]  counties, defined as less than or equal to 80% of the area median income for the county where the renter lives. In the following 12 counties, you should apply to your local rent and utility assistance program INSTEAD OF the HOPE Program: [place], [place], [place], [place], [place], [place], [place], [place], [place], [place],  [place], [place]  If you live outside of [place], contact HOPE call center at [phone] to talk to a Program Representative Monday-Friday, 8am-5pm Note that Native American tribes also received federal funding for rent and utility assistance programs. Recognized members of the following tribes will be served by programs managed by tribal governments, including: [place] Indians, [place], [place], [place] of [place]  and MetLife Eviction Mediation Coordinator, [place], [phone] [email or web address] .[employer], [place], [phone] [email or web address]   Housing Resources [facility]- [place] [address] [place], [place] [address] [phone] www.gha-Ladysmith.[facility] [address] [place], [place] [address] [phone] PhoneCaptions.ch **Programs include: Hospital doctor and Housing Counseling, Healthy Radiographer, therapeutic, Homeless Prevention and Housing Assistance  Government [facility] [address], [place], [place] [address] [phone] www.PaintingEmporium.co.za **housing applications/recertification; tax payment relief/exemption under specific qualifications  [facility] [address] [place] [address], [place], [place] [address] www.onlinegreensboro.com/~maryshouse **transitional housing for women in recovery who have minor children or are pregnant  [facility] [address] [place], [place], [place] [address] https://johnson-smith.net/  **emergency shelter and support services for families facing homelessness  Youth Focus [address] [place], [place] [address] [phone] www.youthfocus.org **transitional housing to pregnant women; emergency housing for youth who have run away, are experiencing a family crisis, are victims of abuse or neglect, or are homeless  [facility] [address] [place], [place] [address] [phone] ircgso.org **Drop-in center for people experiencing homelessness; overnight warming center when temperature is 25 degrees or below  Re-Entry Staffing [address] [place], [place], [place] [address] [phone] https://reentrystaffingagency.org/ **help with affordable housing to people experiencing homelessness or unemployment due to incarceration  [facility] [address], [place], [place] [address] [phone] www.greensborourbanministry.org  **emergency and transitional housing, rent/mortgage assistance, utility assistance  [facility] [address] [place], [place] [address] [phone] www.salvationarmyofgreensboro.org **emergency and transitional housing  Habitat for [employer] [address], [place], [place] [address] [phone] Www.habitatgreensboro.[facility]   National [employer] [address], [place], [place] [address] [phone] https://chshousing.org **Home Ownership/Affordable Housing Program and [facility]  Housing Consultants Group [address], [place], [place] [address] [phone] PaidValue.com.cy **home buyer education courses, foreclosure prevention  [facility] [address], [place], [place] [address] [phone] DefMagazine.is **Environmental Exposure Assessment (investigation of homes where either children or pregnant women with a confirmed elevated blood lead level reside)  [place]  Division of Vocational Rehabilitation-[place] [address] [place], [place] [address] [phone] ShowReturn.ca **Home Expense Assistance/Repairs Program; offers home accessibility updates, such as ramps or bars in the bathroom  Self-Help Credit Union-[place] [address]  [place], [place] [address] [phone] https://www.self-help.org/locations/[place]-branch **Offers credit-building and banking services to people unable to use traditional banking   Housing Resources [facility]  Housing Authority- [place] of [facility] [address] [place], [place] [address] [phone] ChatRepair.pl   [facility] [address] [place], [place] [address] [phone]  WrestlingMonthly.pl **housing applications/recertification, emergency and transitional housing  Open [employer] of [employer] [address] [place], [place] [address] [phone] www.odm-hp.org  **emergency and permanent housing; rent/mortgage payment assistance  Habitat for [employer], [place] and [place] [address] [place] [address], [place], [place] [address] [phone] https://russell-walls.com/  Family Services of the [place], [employer] [address] [place], [place], [place] [address] www.familyservice-piedmont.org **emergency shelter for victims of domestic violence and sexual assault  Senior Resources-[place] [address], [place], [place] [address] [phone] www.senior-resources-[place].org **Home expense assistance/repairs for older adults  Housing Resources [facility] of [place] [address] [place], [place] [address] [phone] http://cohen-reilly.biz/  **May offer help with minor housing repairs  [facility] [address], [place], [place] [address] [phone] **emergency housing for victims of domestic violence  Housing Resources [place], [place], [place] [facility])  [facility] [address], [place], [place] [address] [phone] www.newrha.[facility] [address] [place], [place] [address] [phone]  [facility] [address], [place], [place]  [address] [phone] www.co.[place].us  **Housing applications/recertification; tax payment relief/exemption w specific qualifications  [facility] Help for Homeless [address] [place], [place] [address] [phone] Http://www.rchelpforhomeless.org/HOME_PAGE.html  HELP, Incorporated [facility] [address] [place], [place] [address] [phone] 24 Hour Crisis Line [phone] Hours of Operation: Monday-Friday, 8:30am-5:00pm http://helpincorporated.org **Includes emergency housing for victims of domestic violence

## 2023-12-11 ENCOUNTER — Other Ambulatory Visit: Payer: Self-pay | Admitting: Women's Health

## 2023-12-11 ENCOUNTER — Ambulatory Visit: Admitting: Advanced Practice Midwife

## 2023-12-11 ENCOUNTER — Ambulatory Visit

## 2023-12-11 ENCOUNTER — Encounter: Payer: Self-pay | Admitting: Advanced Practice Midwife

## 2023-12-11 VITALS — BP 107/65 | HR 76 | Wt 170.0 lb

## 2023-12-11 DIAGNOSIS — Z3A24 24 weeks gestation of pregnancy: Secondary | ICD-10-CM

## 2023-12-11 DIAGNOSIS — Z3480 Encounter for supervision of other normal pregnancy, unspecified trimester: Secondary | ICD-10-CM

## 2023-12-11 DIAGNOSIS — Z364 Encounter for antenatal screening for fetal growth retardation: Secondary | ICD-10-CM

## 2023-12-11 DIAGNOSIS — O365990 Maternal care for other known or suspected poor fetal growth, unspecified trimester, not applicable or unspecified: Secondary | ICD-10-CM | POA: Insufficient documentation

## 2023-12-11 DIAGNOSIS — O365920 Maternal care for other known or suspected poor fetal growth, second trimester, not applicable or unspecified: Secondary | ICD-10-CM | POA: Diagnosis not present

## 2023-12-11 DIAGNOSIS — Z3482 Encounter for supervision of other normal pregnancy, second trimester: Secondary | ICD-10-CM

## 2023-12-11 DIAGNOSIS — O365930 Maternal care for other known or suspected poor fetal growth, third trimester, not applicable or unspecified: Secondary | ICD-10-CM

## 2023-12-11 MED ORDER — ONDANSETRON HCL 4 MG PO TABS: 4.0000 mg | ORAL_TABLET | Freq: Three times a day (TID) | ORAL | 1 refills | Status: DC | PRN

## 2023-12-11 NOTE — Progress Notes (Signed)
   LOW-RISK PREGNANCY VISIT Patient name: Latasha Peters MRN [id number]  Date of birth: 04-21-2000 Chief Complaint:   Routine Prenatal Visit (Decreased appetite/)  History of Present Illness:   Latasha Peters is a 23 y.o. [phone] female at [redacted]w[redacted]d with an Estimated Date of Delivery: 03/26/24 being seen today for ongoing management of a low-risk pregnancy.  Today she reports no complaints. Contractions: Not present. Vag. Bleeding: None.  Movement: Present. denies leaking of fluid. Review of Systems:   Pertinent items are noted in HPI Denies abnormal vaginal discharge w/ itching/odor/irritation, headaches, visual changes, shortness of breath, chest pain, abdominal pain, severe nausea/vomiting, or problems with urination or bowel movements unless otherwise stated above. Pertinent History Reviewed:  Reviewed past medical,surgical, social, obstetrical and family history.  Reviewed problem list, medications and allergies.    Physical Assessment:     Vitals:   12/11/23 1052  BP: 107/65  Pulse: 76  Weight: 170 lb (77.1 kg)  Body mass index is 30.11 kg/m.        Physical Examination:   General appearance: Well appearing, and in no distress  Mental status: Alert, oriented to person, place, and time  Skin: Warm & dry  Cardiovascular: Normal heart rate noted  Respiratory: Normal respiratory effort, no distress  Abdomen: Soft, gravid, nontender  Pelvic: Cervical exam deferred         Extremities: Edema: None Chaperone:  N/A   Fetal Status:     Movement: Present  US  24+6 wks,cephalic,cx 3.3 cm,SVP of fluid 5.9 cm,anterior placenta gr 0,FHR 158 bpm,EFW 588 g 4%,AC 12%,RI .69,.61=23%     No results found for this or any previous visit (from the past 24 hours).  Assessment & Plan:    Pregnancy: [id number] at [redacted]w[redacted]d 1. [redacted] weeks gestation of pregnancy (Primary)   2. Supervision of other normal pregnancy, antepartum   3. Fetal growth restriction antepartum Per LHE, repeat EFW in 3-4 weeks -  US  OB Follow Up; Future     Meds:  Meds ordered this encounter  Medications   ondansetron (ZOFRAN) 4 MG tablet    Sig: Take 1 tablet (4 mg total) by mouth every 8 (eight) hours as needed for nausea or vomiting.    Dispense:  30 tablet    Refill:  1    Supervising Provider:   JAYNE VONN DEL [[id number]]   Labs/procedures today: US   Plan:  Continue routine obstetrical care  Next visit: prefers will be in person for US , PN2    Reviewed: Preterm labor symptoms and general obstetric precautions including but not limited to vaginal bleeding, contractions, leaking of fluid and fetal movement were reviewed in detail with the patient.  All questions were answered. has home bp cuff. R. Check bp weekly, let us  know if >140/90.   Follow-up: Return in about 3 weeks (around 01/01/2024) for PN2/LROB, US :EFW.  Future Appointments  Date Time Provider Department Center  12/23/2023  1:45 PM [facility]    Orders Placed This Encounter  Procedures   US  OB Follow Up   Cathlean Ely DNP, CNM 12/11/2023 11:49 AM

## 2023-12-11 NOTE — Patient Instructions (Signed)
1. Before your test, do not eat or drink anything for 8-10 hours prior to your  ?appointment (a small amount of water is allowed and you may take any medicines you normally take). Be sure to drink lots of water the day before. ?2. When you arrive, your blood will be drawn for a ?fasting? blood sugar level.  ?Then you will be given a sweetened carbonated beverage to drink. You should  ?complete drinking this beverage within five minutes. After finishing the  ?beverage, you will have your blood drawn exactly 1 and 2 hours later. Having  ?your blood drawn on time is an important part of this test. A total of three blood  ?samples will be done. ?3. The test takes approximately 2 ? hours. During the test, do not have anything to  ?eat or drink. Do not smoke, chew gum (not even sugarless gum) or use breath mints.  ?4. During the test you should remain close by and seated as much as possible and  ?avoid walking around. You may want to bring a book or something else to  ?occupy your time.  ?5. After your test, you may eat and drink as normal. You may want to bring a snack  ?to eat after the test is finished. Your provider will advise you as to the results of  ?this test and any follow-up if necessary ? ?If your sugar test is positive for gestational diabetes, you will be given an phone call and further instructions discussed. If you wish to know all of your test results before your next appointment, feel free to call the office, or look up your test results on Mychart.  (The range that the lab uses for normal values of the sugar test are not necessarily the range that is used for pregnant women; if your results are within the normal range, they are definitely normal.  However, if a value is deemed "high" by the lab, it may not be too high for a pregnant woman.  We will need to discuss the results if your value(s) fall in the "high" category).   ? ? ?Tdap Vaccine ?It is recommended that you get the Tdap vaccine during the  third trimester of EACH pregnancy to help protect your baby from getting pertussis (whooping cough) ?27-36 weeks is the BEST time to do this so that you can pass the protection on to your baby. During pregnancy is better than after pregnancy, but if you are unable to get it during pregnancy it will be offered at the hospital. ?You will be offered this vaccine in the office after 27 weeks.  If you do not have health insurance, you can get the vaccine from the [facility] (no appointment needed).  ?Everyone who will be around your baby should also be up-to-date on their vaccines. Adults (who are not pregnant) only need 1 dose of Tdap during adulthood. ? ? ? ? ?

## 2023-12-11 NOTE — Progress Notes (Signed)
 US  24+6 wks,cephalic,cx 3.3 cm,SVP of fluid 5.9 cm,anterior placenta gr 0,FHR 158 bpm,EFW 588 g 4%,AC 12%,RI .69,.61=23%

## 2023-12-17 ENCOUNTER — Other Ambulatory Visit: Payer: Self-pay | Admitting: Medical Genetics

## 2023-12-17 DIAGNOSIS — Z006 Encounter for examination for normal comparison and control in clinical research program: Secondary | ICD-10-CM

## 2023-12-22 NOTE — BH Specialist Note (Deleted)
 Integrated Behavioral Health via Telemedicine Visit  12/22/2023 Latasha Peters [id number]  Number of Integrated Behavioral Health Clinician visits: 2- Second Visit  Session Start time: 1347   Session End time: 1419  Total time in minutes: 32  Referring Provider: *** Patient/Family location: Home*** [facility] Provider location: [facility]  All persons participating in visit: Patient Latasha Peters and Latasha Peters ***  Types of Service: {CHL AMB TYPE OF [phone]}  I connected with [name] and/or [name]'s {family members:20773} via  Telephone or Engineer, civil (consulting)  (Video is Caregility application) and verified that I am speaking with the correct person using two identifiers. Discussed confidentiality: Yes   I discussed the limitations of telemedicine and the availability of in person appointments.  Discussed there is a possibility of technology failure and discussed alternative modes of communication if that failure occurs.  I discussed that engaging in this telemedicine visit, they consent to the provision of behavioral healthcare and the services will be billed under their insurance.  Patient and/or legal guardian expressed understanding and consented to Telemedicine visit: Yes   Presenting Concerns: Patient and/or family reports the following symptoms/concerns: *** Duration of problem: ***; Severity of problem: {Mild/Moderate/Severe:20260}  Patient and/or Family's Strengths/Protective Factors: {CHL AMB BH PROTECTIVE [phone]}  Goals Addressed: Patient will:  Reduce symptoms of: {IBH Symptoms:21014056}   Increase knowledge and/or ability of: {IBH Patient Tools:21014057}   Demonstrate ability to: {IBH Goals:21014053}  Progress towards Goals: {CHL AMB BH PROGRESS TOWARDS GOALS:[phone]}    Interventions: Interventions utilized:  {IBH  Interventions:21014054} Standardized Assessments completed: {IBH Screening Tools:21014051}    Patient and/or Family Response:Patient agrees with treatment plan.    Clinical Assessment/Diagnosis  No diagnosis found.   Patient may benefit from continued therapeutic intervention *** .  Plan: Follow up with behavioral health clinician on : *** Behavioral recommendations: *** Referral(s): {IBH Referrals:21014055}  I discussed the assessment and treatment plan with the patient and/or parent/guardian. They were provided an opportunity to ask questions and all were answered. They agreed with the plan and demonstrated an understanding of the instructions.   They were advised to call back or seek an in-person evaluation if the symptoms worsen or if the condition fails to improve as anticipated.  [name], LCSW     09/15/2023    2:35 PM  Depression screen PHQ 2/9  Decreased Interest 0  Down, Depressed, Hopeless 0  PHQ - 2 Score 0  Altered sleeping 0  Tired, decreased energy 3  Change in appetite 1  Feeling bad or failure about yourself  0  Trouble concentrating 1  Moving slowly or fidgety/restless 0  Suicidal thoughts 0  PHQ-9 Score 5      09/15/2023    2:35 PM  GAD 7 : Generalized Anxiety Score  Nervous, Anxious, on Edge 0  Control/stop worrying 0  Worry too much - different things 0  Trouble relaxing 0  Restless 0  Easily annoyed or irritable 0  Afraid - awful might happen 2  Total GAD 7 Score 2

## 2023-12-23 ENCOUNTER — Encounter: Payer: Self-pay | Admitting: Women's Health

## 2023-12-23 ENCOUNTER — Encounter
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
## 2023-12-30 ENCOUNTER — Ambulatory Visit (INDEPENDENT_AMBULATORY_CARE_PROVIDER_SITE_OTHER): Admitting: Clinical

## 2023-12-30 DIAGNOSIS — F419 Anxiety disorder, unspecified: Secondary | ICD-10-CM

## 2023-12-30 DIAGNOSIS — F909 Attention-deficit hyperactivity disorder, unspecified type: Secondary | ICD-10-CM

## 2023-12-31 ENCOUNTER — Other Ambulatory Visit: Payer: Self-pay | Admitting: Advanced Practice Midwife

## 2023-12-31 DIAGNOSIS — O365990 Maternal care for other known or suspected poor fetal growth, unspecified trimester, not applicable or unspecified: Secondary | ICD-10-CM

## 2024-01-01 ENCOUNTER — Encounter: Payer: Self-pay | Admitting: Advanced Practice Midwife

## 2024-01-01 ENCOUNTER — Other Ambulatory Visit: Payer: Self-pay | Admitting: Advanced Practice Midwife

## 2024-01-01 DIAGNOSIS — O365990 Maternal care for other known or suspected poor fetal growth, unspecified trimester, not applicable or unspecified: Secondary | ICD-10-CM

## 2024-01-02 ENCOUNTER — Ambulatory Visit

## 2024-01-02 ENCOUNTER — Ambulatory Visit: Admitting: Obstetrics & Gynecology

## 2024-01-02 ENCOUNTER — Encounter: Payer: Self-pay | Admitting: Obstetrics & Gynecology

## 2024-01-02 ENCOUNTER — Other Ambulatory Visit

## 2024-01-02 ENCOUNTER — Other Ambulatory Visit: Payer: Self-pay | Admitting: Obstetrics & Gynecology

## 2024-01-02 VITALS — BP 109/73 | HR 84 | Wt 177.2 lb

## 2024-01-02 DIAGNOSIS — O09293 Supervision of pregnancy with other poor reproductive or obstetric history, third trimester: Secondary | ICD-10-CM | POA: Diagnosis not present

## 2024-01-02 DIAGNOSIS — Z3480 Encounter for supervision of other normal pregnancy, unspecified trimester: Secondary | ICD-10-CM

## 2024-01-02 DIAGNOSIS — O0993 Supervision of high risk pregnancy, unspecified, third trimester: Secondary | ICD-10-CM | POA: Diagnosis not present

## 2024-01-02 DIAGNOSIS — O365990 Maternal care for other known or suspected poor fetal growth, unspecified trimester, not applicable or unspecified: Secondary | ICD-10-CM

## 2024-01-02 DIAGNOSIS — Z3A28 28 weeks gestation of pregnancy: Secondary | ICD-10-CM

## 2024-01-02 DIAGNOSIS — O365930 Maternal care for other known or suspected poor fetal growth, third trimester, not applicable or unspecified: Secondary | ICD-10-CM

## 2024-01-02 DIAGNOSIS — Z131 Encounter for screening for diabetes mellitus: Secondary | ICD-10-CM

## 2024-01-02 NOTE — Progress Notes (Signed)
 US  28 wks,breech,anterior placenta gr 1,CX 3.7 cm,AFI 17 cm,BPP 8/8,normal ovaries,FHR 150 bpm,RI .69,.71,.72,.70=72%,EFW 874 g 2%,AC 6%,FL .2%

## 2024-01-02 NOTE — Progress Notes (Signed)
 HIGH-RISK PREGNANCY VISIT Patient name: Latasha Peters MRN [id number]  Date of birth: 2000-09-09 Chief Complaint:   Routine Prenatal Visit  History of Present Illness:   Latasha Peters is a 23 y.o. [id number] female at [redacted]w[redacted]d with an Estimated Date of Delivery: 03/26/24 being seen today for ongoing management of a high-risk pregnancy complicated by:  -FGR -Tobacco use -Anxiety - no meds  Today she reports no complaints.   Contractions: Not present. Vag. Bleeding: None.  Movement: Present. denies leaking of fluid.      12/30/2023   11:47 AM 09/15/2023    2:35 PM  Depression screen PHQ 2/9  Decreased Interest 0 0  Down, Depressed, Hopeless 1 0  PHQ - 2 Score 1 0  Altered sleeping 3 0  Tired, decreased energy 3 3  Change in appetite 3 1  Feeling bad or failure about yourself  1 0  Trouble concentrating 3 1  Moving slowly or fidgety/restless 3 0  Suicidal thoughts 0 0  PHQ-9 Score 17 5     Current Outpatient Medications  Medication Instructions   acetaminophen  (TYLENOL ) 1,300 mg, Every 8 hours PRN   Blood Pressure Monitor MISC For regular home bp monitoring during pregnancy   ondansetron (ZOFRAN) 4 mg, Oral, Every 8 hours PRN   ondansetron (ZOFRAN-ODT) 4 mg, Every 8 hours PRN   Prenatal Vit-Fe Fumarate-FA (M-NATAL PLUS ) 27-1 MG TABS ONE TABLET AND ONE CAPSULE DAILY     Review of Systems:   Pertinent items are noted in HPI Denies abnormal vaginal discharge w/ itching/odor/irritation, headaches, visual changes, shortness of breath, chest pain, abdominal pain, severe nausea/vomiting, or problems with urination or bowel movements unless otherwise stated above. Pertinent History Reviewed:  Reviewed past medical,surgical, social, obstetrical and family history.  Reviewed problem list, medications and allergies. Physical Assessment:   Vitals:   01/02/24 1051  BP: 109/73  Pulse: 84  There is no height or weight on file to calculate BMI.           Physical Examination:    General appearance: alert, well appearing, and in no distress  Mental status: normal mood, behavior, speech, dress, motor activity, and thought processes  Skin: warm & dry   Extremities:      Cardiovascular: normal heart rate noted  Respiratory: normal respiratory effort, no distress  Abdomen: gravid, soft, non-tender  Pelvic: Cervical exam deferred         Fetal Status:     Movement: Present    Fetal Surveillance Testing today: breech,anterior placenta gr 1,CX 3.7 cm,AFI 17 cm,BPP 8/8,normal ovaries,FHR 150 bpm,RI .69,.71,.72,.70=72%,EFW 874 g 2%,AC 6%,FL .2%    Chaperone: N/A    No results found for this or any previous visit (from the past 24 hours).   Assessment & Plan:  High-risk pregnancy: [id number] at [redacted]w[redacted]d with an Estimated Date of Delivery: 03/26/24   1) FGR - Reviewed diagnosis, discussed plan for antepartum testing and reviewed potential for early delivery pending fetal status -Plan for BPP and Dopplers weekly - Add NSTs at 32 weeks -delivery per [facility] guidelines - Questions and concerns were addressed  2) Rh neg []  plan to completed PN2 next available  3) tobacco use 4) anxiety- no meds 5) h/o Chiari malformation  Meds: No orders of the defined types were placed in this encounter.   Labs/procedures today: growth, dopplers  Treatment Plan:  as outlined above and routine OB care.  PN2 to be scheduled, RhoGAM next visit  Reviewed: Preterm labor symptoms and general obstetric  precautions including but not limited to vaginal bleeding, contractions, leaking of fluid and fetal movement were reviewed in detail with the patient.  All questions were answered.  Follow-up: No follow-ups on file.   Future Appointments  Date Time Provider Department Center  01/15/2024  1:45 PM [facility] HEALTH CLINICIAN [facility]    No orders of the defined types were placed in this encounter.   Lillieanna Tuohy, DO Attending Obstetrician & Gynecologist, [facility]  for [employer], [facility]

## 2024-01-05 ENCOUNTER — Ambulatory Visit: Admitting: Obstetrics & Gynecology

## 2024-01-05 ENCOUNTER — Encounter: Payer: Self-pay | Admitting: Obstetrics & Gynecology

## 2024-01-05 ENCOUNTER — Ambulatory Visit

## 2024-01-05 VITALS — BP 128/75 | HR 80 | Wt 175.0 lb

## 2024-01-05 DIAGNOSIS — Z3A28 28 weeks gestation of pregnancy: Secondary | ICD-10-CM

## 2024-01-05 DIAGNOSIS — Z23 Encounter for immunization: Secondary | ICD-10-CM | POA: Diagnosis not present

## 2024-01-05 DIAGNOSIS — Z3480 Encounter for supervision of other normal pregnancy, unspecified trimester: Secondary | ICD-10-CM

## 2024-01-05 DIAGNOSIS — O365930 Maternal care for other known or suspected poor fetal growth, third trimester, not applicable or unspecified: Secondary | ICD-10-CM

## 2024-01-05 DIAGNOSIS — Z3483 Encounter for supervision of other normal pregnancy, third trimester: Secondary | ICD-10-CM

## 2024-01-05 DIAGNOSIS — O365990 Maternal care for other known or suspected poor fetal growth, unspecified trimester, not applicable or unspecified: Secondary | ICD-10-CM

## 2024-01-05 NOTE — Progress Notes (Signed)
 HIGH-RISK PREGNANCY VISIT Patient name: Mikiah Durall MRN [id number]  Date of birth: [birth year] Chief Complaint:   Routine Prenatal Visit (U/s today)  History of Present Illness:   Aalani Aikens is a 23 y.o. [phone] female at [redacted]w[redacted]d with an Estimated Date of Delivery: 03/26/24 being seen today for ongoing management of a high-risk pregnancy complicated by:  -FGR -Rh neg -Chiari malformation -Rubella non-immune  Today she reports no complaints.   Contractions: Not present. Vag. Bleeding: None.  Movement: Present. denies leaking of fluid.      12/30/2023   11:47 AM 09/15/2023    2:35 PM  Depression screen PHQ 2/9  Decreased Interest 0 0  Down, Depressed, Hopeless 1 0  PHQ - 2 Score 1 0  Altered sleeping 3 0  Tired, decreased energy 3 3  Change in appetite 3 1  Feeling bad or failure about yourself  1 0  Trouble concentrating 3 1  Moving slowly or fidgety/restless 3 0  Suicidal thoughts 0 0  PHQ-9 Score 17 5     Current Outpatient Medications  Medication Instructions   acetaminophen  (TYLENOL ) 1,300 mg, Every 8 hours PRN   Blood Pressure Monitor MISC For regular home bp monitoring during pregnancy   ondansetron (ZOFRAN) 4 mg, Oral, Every 8 hours PRN   ondansetron (ZOFRAN-ODT) 4 mg, Every 8 hours PRN   Prenatal Vit-Fe Fumarate-FA (M-NATAL PLUS ) 27-1 MG TABS ONE TABLET AND ONE CAPSULE DAILY     Review of Systems:   Pertinent items are noted in HPI Denies abnormal vaginal discharge w/ itching/odor/irritation, headaches, visual changes, shortness of breath, chest pain, abdominal pain, severe nausea/vomiting, or problems with urination or bowel movements unless otherwise stated above. Pertinent History Reviewed:  Reviewed past medical,surgical, social, obstetrical and family history.  Reviewed problem list, medications and allergies. Physical Assessment:   Vitals:   01/05/24 1227  BP: 128/75  Pulse: 80  Weight: 175 lb (79.4 kg)  Body mass index is 31 kg/m.            Physical Examination:   General appearance: alert, well appearing, and in no distress  Mental status: normal mood, behavior, speech, dress, motor activity, and thought processes  Skin: warm & dry   Extremities: Edema: None    Cardiovascular: normal heart rate noted  Respiratory: normal respiratory effort, no distress  Abdomen: gravid, soft, non-tender  Pelvic: Cervical exam deferred         Fetal Status:     Movement: Present    Fetal Surveillance Testing today: BPP 8/8,anterior placenta gr 1,FHR 139 bpm,AFI 18 cm,RI .68,.69,.75,.67=74%    Chaperone: N/A    No results found for this or any previous visit (from the past 24 hours).   Assessment & Plan:  High-risk pregnancy: [id number] at [redacted]w[redacted]d with an Estimated Date of Delivery: 03/26/24   -FGR Normal testing today Continue antepartum testing weekly  -Rh neg []  added to panorama further management pending  -Chiari malformation  Meds: No orders of the defined types were placed in this encounter.   Labs/procedures today: BPP  Treatment Plan:  continue routine OB care and as outlined above  Reviewed: Preterm labor symptoms and general obstetric precautions including but not limited to vaginal bleeding, contractions, leaking of fluid and fetal movement were reviewed in detail with the patient.  All questions were answered. Pt has home bp cuff. Check bp weekly, let us  know if >140/90.   Follow-up: Return in about 1 week (around 01/12/2024) for HROB visit.   Future  Appointments  Date Time Provider Department Center  01/05/2024  1:30 PM Marilynn Nest, DO [facility]  01/08/2024  8:30 AM [facility] LAB [facility]  01/12/2024  3:00 PM [facility] - [facility] US  [facility] None  01/12/2024  3:30 PM Kizzie Suzen SAUNDERS, CNM [facility]  01/15/2024  1:45 PM [facility]  01/19/2024  3:00 PM [facility] - [facility] US  [facility] None  01/19/2024  3:50 PM Jayne Vonn DEL, MD [facility]  01/26/2024  2:15 PM [facility] -  FT[facility]GYN US  [facility] None  01/26/2024  3:10 PM Marilynn Nest, DO [facility]  02/02/2024 10:45 AM [facility] - [facility] US  [facility] None  02/02/2024 11:50 AM Marilynn Nest, DO [facility]  02/05/2024  9:50 AM [facility] NURSE [facility]  02/09/2024  9:15 AM [facility] - [facility] US  [facility] None  02/09/2024 10:10 AM Kizzie Suzen SAUNDERS, CNM [facility]  02/12/2024  9:30 AM [facility] NURSE [facility]  02/16/2024 10:45 AM [facility] - FT IMG 2 [facility] None  02/16/2024 11:30 AM Kizzie Suzen SAUNDERS, CNM [facility]  02/19/2024  9:50 AM [facility] NURSE [facility]  02/23/2024  1:30 PM [facility] - FT IMG 2 [facility] None  02/23/2024  2:30 PM Jayne Vonn DEL, MD [facility]  03/01/2024 10:15 AM [facility] FT IMG 2 [facility] None  03/01/2024 11:10 AM Kizzie Suzen SAUNDERS, CNM [facility]  03/08/2024 10:00 AM [facility] - FT IMG 2 [facility] None  03/11/2024 10:50 AM [facility] NURSE [facility]  03/15/2024 10:00 AM [facility] - FT IMG 2 [facility] None  03/18/2024  9:50 AM [facility] NURSE [facility]  03/22/2024 10:00 AM [facility] - FT IMG 2 [facility] None  03/25/2024  9:30 AM [facility] NURSE [facility]    Orders Placed This Encounter  Procedures   Tdap vaccine greater than or equal to 7yo IM    Lakeyta Vandenheuvel, DO Attending Obstetrician & Gynecologist, [facility] for [employer], [facility]

## 2024-01-05 NOTE — Progress Notes (Signed)
 US  28+3 wks,cephalic,BPP 8/8,anterior placenta gr 1,FHR 139 bpm,AFI 18 cm,RI .68,.69,.75,.67=74%

## 2024-01-06 LAB — PANORAMA PRENATAL TEST FULL PANEL:PANORAMA TEST PLUS 5 ADDITIONAL MICRODELETIONS: FETAL FRACTION: 13.4

## 2024-01-08 ENCOUNTER — Other Ambulatory Visit

## 2024-01-08 ENCOUNTER — Ambulatory Visit

## 2024-01-08 DIAGNOSIS — Z6791 Unspecified blood type, Rh negative: Secondary | ICD-10-CM | POA: Diagnosis not present

## 2024-01-08 DIAGNOSIS — Z2913 Encounter for prophylactic Rho(D) immune globulin: Secondary | ICD-10-CM

## 2024-01-08 NOTE — Progress Notes (Signed)
   NURSE VISIT- INJECTION  SUBJECTIVE:  Latasha Peters is a 23 y.o. [phone] female here for a Rhophylac for Rh neg status during pregnancy. She is [redacted]w[redacted]d pregnant.   OBJECTIVE:  LMP 05/25/2023 (Approximate)   Appears well, in no apparent distress  Injection administered in: Left upper quad. gluteus  No orders of the defined types were placed in this encounter.   ASSESSMENT: Pregnancy [redacted]w[redacted]d Rhophylac for Rh neg status during pregnancy PLAN: Follow-up: as scheduled   Aleck FORBES Blase  01/08/2024 9:43 AM

## 2024-01-09 ENCOUNTER — Ambulatory Visit: Payer: Self-pay | Admitting: Obstetrics & Gynecology

## 2024-01-09 ENCOUNTER — Other Ambulatory Visit: Payer: Self-pay | Admitting: Obstetrics & Gynecology

## 2024-01-09 DIAGNOSIS — Z3480 Encounter for supervision of other normal pregnancy, unspecified trimester: Secondary | ICD-10-CM

## 2024-01-09 DIAGNOSIS — O365990 Maternal care for other known or suspected poor fetal growth, unspecified trimester, not applicable or unspecified: Secondary | ICD-10-CM

## 2024-01-09 LAB — CBC
Hematocrit: 39.1 % (ref 34.0–46.6)
Hemoglobin: 13.1 g/dL (ref 11.1–15.9)
MCH: 30 pg (ref 26.6–33.0)
MCHC: 33.5 g/dL (ref 31.5–35.7)
MCV: 90 fL (ref 79–97)
Platelets: 229 x10E3/uL (ref 150–450)
RBC: 4.36 x10E6/uL (ref 3.77–5.28)
RDW: 13 % (ref 11.7–15.4)
WBC: 10.5 x10E3/uL (ref 3.4–10.8)

## 2024-01-09 LAB — GLUCOSE TOLERANCE, 2 HOURS W/ 1HR
Glucose, 1 hour: 124 mg/dL (ref 70–179)
Glucose, 2 hour: 90 mg/dL (ref 70–152)
Glucose, Fasting: 82 mg/dL (ref 70–91)

## 2024-01-09 LAB — ANTIBODY SCREEN: Antibody Screen: NEGATIVE

## 2024-01-09 LAB — HIV ANTIBODY (ROUTINE TESTING W REFLEX): HIV Screen 4th Generation wRfx: NONREACTIVE

## 2024-01-09 LAB — RPR: RPR Ser Ql: NONREACTIVE

## 2024-01-10 LAB — GENECONNECT MOLECULAR SCREEN: Genetic Analysis Overall Interpretation: NEGATIVE

## 2024-01-12 ENCOUNTER — Ambulatory Visit (INDEPENDENT_AMBULATORY_CARE_PROVIDER_SITE_OTHER): Admitting: Women's Health

## 2024-01-12 ENCOUNTER — Encounter: Payer: Self-pay | Admitting: Women's Health

## 2024-01-12 ENCOUNTER — Ambulatory Visit

## 2024-01-12 VITALS — BP 106/65 | HR 58 | Wt 175.0 lb

## 2024-01-12 DIAGNOSIS — Z3480 Encounter for supervision of other normal pregnancy, unspecified trimester: Secondary | ICD-10-CM

## 2024-01-12 DIAGNOSIS — O365930 Maternal care for other known or suspected poor fetal growth, third trimester, not applicable or unspecified: Secondary | ICD-10-CM

## 2024-01-12 DIAGNOSIS — Z3A29 29 weeks gestation of pregnancy: Secondary | ICD-10-CM

## 2024-01-12 DIAGNOSIS — O0993 Supervision of high risk pregnancy, unspecified, third trimester: Secondary | ICD-10-CM

## 2024-01-12 DIAGNOSIS — O365990 Maternal care for other known or suspected poor fetal growth, unspecified trimester, not applicable or unspecified: Secondary | ICD-10-CM

## 2024-01-12 NOTE — Progress Notes (Signed)
 US  29+3 wks,breech,FHR 145 bpm,anterior placenta gr 3,BPP 8/8,RI .66,.68,.74,.73=82%,AFI 17 cm

## 2024-01-12 NOTE — Progress Notes (Signed)
 HIGH-RISK PREGNANCY VISIT Patient name: Latasha Peters MRN [id number]  Date of birth: [birth year] Chief Complaint:   Routine Prenatal Visit  History of Present Illness:   Latasha Peters is a 23 y.o. [id number] female at [redacted]w[redacted]d with an Estimated Date of Delivery: 03/26/24 being seen today for ongoing management of a high-risk pregnancy complicated by fetal growth restriction 2% w/ normal UAD.    Today she reports decreased appetite, no n/v, no depression. Contractions: Irritability. Vag. Bleeding: None.  Movement: Present. denies leaking of fluid.      12/30/2023   11:47 AM 09/15/2023    2:35 PM  Depression screen PHQ 2/9  Decreased Interest 0 0  Down, Depressed, Hopeless 1 0  PHQ - 2 Score 1 0  Altered sleeping 3 0  Tired, decreased energy 3 3  Change in appetite 3 1  Feeling bad or failure about yourself  1 0  Trouble concentrating 3 1  Moving slowly or fidgety/restless 3 0  Suicidal thoughts 0 0  PHQ-9 Score 17  5      Data saved with a previous flowsheet row definition        12/30/2023   11:50 AM 09/15/2023    2:35 PM  GAD 7 : Generalized Anxiety Score  Nervous, Anxious, on Edge 3 0  Control/stop worrying 1 0  Worry too much - different things 3 0  Trouble relaxing 1 0  Restless 3 0  Easily annoyed or irritable 0 0  Afraid - awful might happen 3 2  Total GAD 7 Score 14 2     Review of Systems:   Pertinent items are noted in HPI Denies abnormal vaginal discharge w/ itching/odor/irritation, headaches, visual changes, shortness of breath, chest pain, abdominal pain, severe nausea/vomiting, or problems with urination or bowel movements unless otherwise stated above. Pertinent History Reviewed:  Reviewed past medical,surgical, social, obstetrical and family history.  Reviewed problem list, medications and allergies. Physical Assessment:   Vitals:   01/12/24 1535  BP: 106/65  Pulse: (!) 58  Weight: 175 lb (79.4 kg)  Body mass index is 31 kg/m.           Physical  Examination:   General appearance: alert, well appearing, and in no distress  Mental status: alert, oriented to person, place, and time  Skin: warm & dry   Extremities:      Cardiovascular: normal heart rate noted  Respiratory: normal respiratory effort, no distress  Abdomen: gravid, soft, non-tender  Pelvic: Cervical exam deferred         Fetal Status:     Movement: Present    Fetal Surveillance Testing today: US  29+3 wks,breech,FHR 145 bpm,anterior placenta gr 3,BPP 8/8,RI .66,.68,.74,.73=82%,AFI 17 cm   Chaperone: N/A  No results found for this or any previous visit (from the past 24 hours).  Assessment & Plan:  High-risk pregnancy: [id number] at [redacted]w[redacted]d with an Estimated Date of Delivery: 03/26/24   1) FGR 2% w/ normal UAD, bpp 8/8  2) Decreased appetite, no n/v/depression, eat small frequent snacks throughout day  Meds: No orders of the defined types were placed in this encounter.   Labs/procedures today: U/S  Treatment Plan:    UAD EFW Testing Delivery  EFW/AC <3% Q 1wk Q 3wk 28w-weekly BPP 32w-add NST 37.0    Reviewed: Preterm labor symptoms and general obstetric precautions including but not limited to vaginal bleeding, contractions, leaking of fluid and fetal movement were reviewed in detail with the patient.  All questions were answered. Does  have home bp cuff. Office bp cuff given: not applicable. Check bp weekly, let us  know if consistently >140 and/or >90.  Follow-up: Return for As scheduled.   Future Appointments  Date Time Provider Department Center  01/15/2024  1:45 PM [facility] HEALTH CLINICIAN [facility]  01/19/2024  3:00 PM [facility] - [facility] US  [facility] None  01/19/2024  3:50 PM Jayne Vonn DEL, MD [facility]  01/26/2024  2:15 PM [facility] US  [facility] None  01/26/2024  3:10 PM Marilynn Nest, DO [facility]  02/02/2024 10:45 AM [facility] US  [facility] None  02/02/2024 11:50 AM Marilynn Nest, DO [facility]  02/05/2024  9:50 AM [facility]  NURSE [facility]  02/09/2024  9:15 AM [facility] US  [facility] None  02/09/2024 10:10 AM Kizzie Suzen SAUNDERS, CNM [facility]  02/12/2024  9:30 AM [facility] NURSE [facility]  02/16/2024 10:45 AM [facility] FT IMG 2 [facility] None  02/16/2024 11:30 AM Kizzie Suzen SAUNDERS, CNM [facility]  02/19/2024  9:50 AM [facility] NURSE [facility]  02/23/2024  1:30 PM [facility] FT IMG 2 [facility] None  02/23/2024  2:30 PM Jayne Vonn DEL, MD [facility]  03/01/2024 10:15 AM [facility] FT IMG 2 [facility] None  03/01/2024 11:10 AM Kizzie Suzen SAUNDERS, CNM [facility]  03/08/2024 10:00 AM [facility] FT IMG 2 [facility] None  03/11/2024 10:50 AM [facility] NURSE [facility]  03/15/2024 10:00 AM [facility] FT IMG 2 [facility] None  03/18/2024  9:50 AM [facility] NURSE [facility]  03/22/2024 10:00 AM [facility] FT IMG 2 [facility] None  03/25/2024  9:30 AM [facility] NURSE [facility]    Orders Placed This Encounter  Procedures   US  OB Follow Up   US  FETAL BPP WO NON STRESS   US  UA Cord Doppler   Suzen SAUNDERS Kizzie CNM, [facility] 01/12/2024 3:59 PM

## 2024-01-12 NOTE — Patient Instructions (Signed)
Latasha Peters, thank you for choosing our office today! We appreciate the opportunity to meet your healthcare needs. You may receive a short survey by mail, e-mail, or through [employer]. If you are happy with your care we would appreciate if you could take just a few minutes to complete the survey questions. We read all of your comments and take your feedback very seriously. Thank you again for choosing our office.  Center for [employer] Team at [facility]  [facility] at [facility] ([address] [place], [place] [address]) Entrance C, located off of [address] Free 24/7 valet parking   CLASSES: Go to [email or web address] to register for classes (childbirth, breastfeeding, waterbirth, infant CPR, daddy bootcamp, etc.)  Call the office [phone]) or go to [facility] if: You begin to have strong, frequent contractions Your water breaks.  Sometimes it is a big gush of fluid, sometimes it is just a trickle that keeps getting your panties wet or running down your legs You have vaginal bleeding.  It is normal to have a small amount of spotting if your cervix was checked.  You don't feel your baby moving like normal.  If you don't, get you something to eat and drink and lay down and focus on feeling your baby move.   If your baby is still not moving like normal, you should call the office or go to [facility].  Call the office [phone]) or go to [facility] hospital for these signs of pre-eclampsia: Severe headache that does not go away with Tylenol Visual changes- seeing spots, double, blurred vision Pain under your right breast or upper abdomen that does not go away with Tums or heartburn medicine Nausea and/or vomiting Severe swelling in your hands, feet, and face   Tdap Vaccine It is recommended that you get the Tdap vaccine during the third trimester of EACH pregnancy to help protect your baby from getting pertussis (whooping cough) 27-36 weeks is the BEST time to do  this so that you can pass the protection on to your baby. During pregnancy is better than after pregnancy, but if you are unable to get it during pregnancy it will be offered at the hospital.  You can get this vaccine with [place], at the health department, your family doctor, or some local pharmacies Everyone who will be around your baby should also be up-to-date on their vaccines before the baby comes. Adults (who are not pregnant) only need 1 dose of Tdap during adulthood.   [facility] Pediatricians/Family Doctors [facility]): [address] [place], [phone]           [facility]: [address], [phone]                [facility]): [address], [phone] (call to ask if accepting patients) [facility] Department: [address], [place], [phone]    [facility] Pediatricians/Family Doctors [facility]): [phone] [address], [phone] [facility]: [address] [place], [phone] [facility]: [address], [phone]  [facility] Doctors  [facility]): [phone] [facility]: [address] [phone]   [facility] Doctors [facility]: [address], [phone]  [facility] Family Doctors  [facility]: [phone], [phone]  Home Blood Pressure Monitoring for Patients   Your provider has recommended that you check your  blood pressure (BP) at least once a week at home. If you do not have a blood pressure cuff at home, one will be provided for you. Contact your provider if you have not received your monitor within 1 week.   Helpful Tips for Accurate Home Blood Pressure Checks  Don't smoke, exercise, or drink caffeine 30 minutes before checking your BP Use the restroom before checking your BP (a full bladder can raise your  pressure) Relax in a comfortable upright chair Feet on the ground Left arm resting comfortably on a flat surface at the level of your heart Legs uncrossed Back supported Sit quietly and don't talk Place the cuff on your bare arm Adjust snuggly, so that only two fingertips can fit between your skin and the top of the cuff Check 2 readings separated by at least one minute Keep a log of your BP readings For a visual, please reference this diagram: http://ccnc.care/bpdiagram  Provider Name: [facility]     Phone: [phone]  Zone 1: ALL CLEAR  Continue to monitor your symptoms:  BP reading is less than 140 (top number) or less than 90 (bottom number)  No right upper stomach pain No headaches or seeing spots No feeling nauseated or throwing up No swelling in face and hands  Zone 2: CAUTION Call your doctor's office for any of the following:  BP reading is greater than 140 (top number) or greater than 90 (bottom number)  Stomach pain under your ribs in the middle or right side Headaches or seeing spots Feeling nauseated or throwing up Swelling in face and hands  Zone 3: EMERGENCY  Seek immediate medical care if you have any of the following:  BP reading is greater than160 (top number) or greater than 110 (bottom number) Severe headaches not improving with Tylenol Serious difficulty catching your breath Any worsening symptoms from Zone 2   Third Trimester of Pregnancy The third trimester is from week 29 through week 42, months 7 through 9. The third trimester is a time when the fetus is growing rapidly. At the end of the ninth month, the fetus is about 20 inches in length and weighs 6-10 pounds.  BODY CHANGES Your body goes through many changes during pregnancy. The changes vary from woman to woman.  Your weight will continue to increase. You can expect to gain 25-35 pounds (11-16 kg) by the end of the pregnancy. You may begin to get stretch marks on your hips, abdomen,  and breasts. You may urinate more often because the fetus is moving lower into your pelvis and pressing on your bladder. You may develop or continue to have heartburn as a result of your pregnancy. You may develop constipation because certain hormones are causing the muscles that push waste through your intestines to slow down. You may develop hemorrhoids or swollen, bulging veins (varicose veins). You may have pelvic pain because of the weight gain and pregnancy hormones relaxing your joints between the bones in your pelvis. Backaches may result from overexertion of the muscles supporting your posture. You may have changes in your hair. These can include thickening of your hair, rapid growth, and changes in texture. Some women also have hair loss during or after pregnancy, or hair that feels dry or thin. Your hair will most likely return to normal after your baby is born. Your breasts will continue to grow and be tender. A yellow discharge may leak from your breasts called colostrum. Your belly button may stick out. You may   feel short of breath because of your expanding uterus. You may notice the fetus "dropping," or moving lower in your abdomen. You may have a bloody mucus discharge. This usually occurs a few days to a week before labor begins. Your cervix becomes thin and soft (effaced) near your due date. WHAT TO EXPECT AT YOUR PRENATAL EXAMS  You will have prenatal exams every 2 weeks until week 36. Then, you will have weekly prenatal exams. During a routine prenatal visit: You will be weighed to make sure you and the fetus are growing normally. Your blood pressure is taken. Your abdomen will be measured to track your baby's growth. The fetal heartbeat will be listened to. Any test results from the previous visit will be discussed. You may have a cervical check near your due date to see if you have effaced. At around 36 weeks, your caregiver will check your cervix. At the same time, your  caregiver will also perform a test on the secretions of the vaginal tissue. This test is to determine if a type of bacteria, Group B streptococcus, is present. Your caregiver will explain this further. Your caregiver may ask you: What your birth plan is. How you are feeling. If you are feeling the baby move. If you have had any abnormal symptoms, such as leaking fluid, bleeding, severe headaches, or abdominal cramping. If you have any questions. Other tests or screenings that may be performed during your third trimester include: Blood tests that check for low iron levels (anemia). Fetal testing to check the health, activity level, and growth of the fetus. Testing is done if you have certain medical conditions or if there are problems during the pregnancy. FALSE LABOR You may feel small, irregular contractions that eventually go away. These are called Braxton Hicks contractions, or false labor. Contractions may last for hours, days, or even weeks before true labor sets in. If contractions come at regular intervals, intensify, or become painful, it is best to be seen by your caregiver.  SIGNS OF LABOR  Menstrual-like cramps. Contractions that are 5 minutes apart or less. Contractions that start on the top of the uterus and spread down to the lower abdomen and back. A sense of increased pelvic pressure or back pain. A watery or bloody mucus discharge that comes from the vagina. If you have any of these signs before the 37th week of pregnancy, call your caregiver right away. You need to go to the hospital to get checked immediately. HOME CARE INSTRUCTIONS  Avoid all smoking, herbs, alcohol, and unprescribed drugs. These chemicals affect the formation and growth of the baby. Follow your caregiver's instructions regarding medicine use. There are medicines that are either safe or unsafe to take during pregnancy. Exercise only as directed by your caregiver. Experiencing uterine cramps is a good sign to  stop exercising. Continue to eat regular, healthy meals. Wear a good support bra for breast tenderness. Do not use hot tubs, steam rooms, or saunas. Wear your seat belt at all times when driving. Avoid raw meat, uncooked cheese, cat litter boxes, and soil used by cats. These carry germs that can cause birth defects in the baby. Take your prenatal vitamins. Try taking a stool softener (if your caregiver approves) if you develop constipation. Eat more high-fiber foods, such as fresh vegetables or fruit and whole grains. Drink plenty of fluids to keep your urine clear or pale yellow. Take warm sitz baths to soothe any pain or discomfort caused by hemorrhoids. Use hemorrhoid cream if   your caregiver approves. If you develop varicose veins, wear support hose. Elevate your feet for 15 minutes, 3-4 times a day. Limit salt in your diet. Avoid heavy lifting, wear low heal shoes, and practice good posture. Rest a lot with your legs elevated if you have leg cramps or low back pain. Visit your dentist if you have not gone during your pregnancy. Use a soft toothbrush to brush your teeth and be gentle when you floss. A sexual relationship may be continued unless your caregiver directs you otherwise. Do not travel far distances unless it is absolutely necessary and only with the approval of your caregiver. Take prenatal classes to understand, practice, and ask questions about the labor and delivery. Make a trial run to the hospital. Pack your hospital bag. Prepare the baby's nursery. Continue to go to all your prenatal visits as directed by your caregiver. SEEK MEDICAL CARE IF: You are unsure if you are in labor or if your water has broken. You have dizziness. You have mild pelvic cramps, pelvic pressure, or nagging pain in your abdominal area. You have persistent nausea, vomiting, or diarrhea. You have a bad smelling vaginal discharge. You have pain with urination. SEEK IMMEDIATE MEDICAL CARE IF:  You  have a fever. You are leaking fluid from your vagina. You have spotting or bleeding from your vagina. You have severe abdominal cramping or pain. You have rapid weight loss or gain. You have shortness of breath with chest pain. You notice sudden or extreme swelling of your face, hands, ankles, feet, or legs. You have not felt your baby move in over an hour. You have severe headaches that do not go away with medicine. You have vision changes. Document Released: 02/12/2001 Document Revised: 02/23/2013 Document Reviewed: 04/21/2012 [facility] Patient Information 2015 [place], [place]. This information is not intended to replace advice given to you by your health care provider. Make sure you discuss any questions you have with your health care provider.

## 2024-01-15 ENCOUNTER — Ambulatory Visit (INDEPENDENT_AMBULATORY_CARE_PROVIDER_SITE_OTHER): Payer: Self-pay | Admitting: Clinical

## 2024-01-15 DIAGNOSIS — F419 Anxiety disorder, unspecified: Secondary | ICD-10-CM | POA: Diagnosis not present

## 2024-01-15 DIAGNOSIS — F909 Attention-deficit hyperactivity disorder, unspecified type: Secondary | ICD-10-CM

## 2024-01-15 NOTE — BH Specialist Note (Unsigned)
 Integrated Behavioral Health via Telemedicine Visit  01/16/2024 Latasha Peters [id number]  Number of Integrated Behavioral Health Clinician visits: 4- Fourth Visit  Session Start time: 1345   Session End time: 1416  Total time in minutes: 31  Referring Provider: Suzen Fetters, CNM Patient/Family location: Home [facility] Provider location: [facility]  All persons participating in visit: Patient Latasha Peters and [facility] Stephon Weathers   Types of Service: Individual psychotherapy and Video visit  I connected with [name] and/or Masen Tesar's n/a via  Telephone or Video Enabled Telemedicine Application  (Video is Caregility application) and verified that I am speaking with the correct person using two identifiers. Discussed confidentiality: Yes   I discussed the limitations of telemedicine and the availability of in person appointments.  Discussed there is a possibility of technology failure and discussed alternative modes of communication if that failure occurs.  I discussed that engaging in this telemedicine visit, they consent to the provision of behavioral healthcare and the services will be billed under their insurance.  Patient and/or legal guardian expressed understanding and consented to Telemedicine visit: Yes   Presenting Concerns: Patient and/or family reports the following symptoms/concerns: Recent conflict with family; sickness in household; mood improvement and reduced anxiety.  Duration of problem: Ongoing/pregnancy increase; Severity of problem: moderate  Patient and/or Family's Strengths/Protective Factors: Social connections, Concrete supports in place (healthy food, safe environments, etc.), and Sense of purpose  Goals Addressed: Patient will:  Reduce symptoms of: anxiety and stress   Increase knowledge and/or ability of: stress reduction   Demonstrate ability to: Increase motivation to adhere to plan of  care  Progress towards Goals: Ongoing  Interventions: Interventions utilized:  Motivational Interviewing and Supportive Reflection Standardized Assessments completed: GAD-7 and PHQ 9  Patient and/or Family Response: Patient agrees with treatment plan.  Clinical Assessment/Diagnosis  Anxiety disorder, unspecified type  Attention deficit hyperactivity disorder (ADHD), unspecified ADHD type   Patient may benefit from continued therapeutic intervention.  Plan: Follow up with behavioral health clinician on : one month Behavioral recommendations:  -Continue taking prenatal vitamin as recommended -Continue daily healthy self-care and self-coping strategies as needed (healthy meals/sleep; time with supportive people in life; decrease time with unsupportive people in life; continue setting healthy boundaries with others)  Referral(s): Integrated [employer] (In Clinic)  I discussed the assessment and treatment plan with the patient and/or parent/guardian. They were provided an opportunity to ask questions and all were answered. They agreed with the plan and demonstrated an understanding of the instructions.   They were advised to call back or seek an in-person evaluation if the symptoms worsen or if the condition fails to improve as anticipated.  Warren BROCKS Merian Wroe, LCSW     01/15/2024    2:06 PM 12/30/2023   11:47 AM 09/15/2023    2:35 PM  Depression screen PHQ 2/9  Decreased Interest 0 0 0  Down, Depressed, Hopeless 0 1 0  PHQ - 2 Score 0 1 0  Altered sleeping 3 3 0  Tired, decreased energy 3 3 3   Change in appetite 0 3 1  Feeling bad or failure about yourself  0 1 0  Trouble concentrating 1 3 1   Moving slowly or fidgety/restless 0 3 0  Suicidal thoughts 0 0 0  PHQ-9 Score 7 17  5       Data saved with a previous flowsheet row definition      01/15/2024    2:07 PM 12/30/2023  11:50 AM 09/15/2023    2:35 PM  GAD 7 : Generalized Anxiety Score  Nervous,  Anxious, on Edge 1 3 0  Control/stop worrying 1 1 0  Worry too much - different things 0 3 0  Trouble relaxing 1 1 0  Restless 0 3 0  Easily annoyed or irritable 1 0 0  Afraid - awful might happen 1 3 2   Total GAD 7 Score 5 14 2

## 2024-01-16 ENCOUNTER — Encounter: Payer: Self-pay | Admitting: Obstetrics & Gynecology

## 2024-01-19 ENCOUNTER — Ambulatory Visit (INDEPENDENT_AMBULATORY_CARE_PROVIDER_SITE_OTHER)

## 2024-01-19 ENCOUNTER — Ambulatory Visit (INDEPENDENT_AMBULATORY_CARE_PROVIDER_SITE_OTHER): Admitting: Obstetrics & Gynecology

## 2024-01-19 VITALS — BP 107/67 | HR 71 | Wt 175.5 lb

## 2024-01-19 DIAGNOSIS — G935 Compression of brain: Secondary | ICD-10-CM | POA: Diagnosis not present

## 2024-01-19 DIAGNOSIS — O0993 Supervision of high risk pregnancy, unspecified, third trimester: Secondary | ICD-10-CM | POA: Insufficient documentation

## 2024-01-19 DIAGNOSIS — O365990 Maternal care for other known or suspected poor fetal growth, unspecified trimester, not applicable or unspecified: Secondary | ICD-10-CM

## 2024-01-19 DIAGNOSIS — O365930 Maternal care for other known or suspected poor fetal growth, third trimester, not applicable or unspecified: Secondary | ICD-10-CM | POA: Diagnosis not present

## 2024-01-19 DIAGNOSIS — F419 Anxiety disorder, unspecified: Secondary | ICD-10-CM | POA: Diagnosis not present

## 2024-01-19 DIAGNOSIS — Z3A30 30 weeks gestation of pregnancy: Secondary | ICD-10-CM

## 2024-01-19 NOTE — Progress Notes (Signed)
 HIGH-RISK PREGNANCY VISIT Patient name: Latasha Peters MRN [id number]  Date of birth: 2000/10/14 Chief Complaint:   Routine Prenatal Visit  History of Present Illness:   Latasha Peters is a 23 y.o. [id number] female at [redacted]w[redacted]d with an Estimated Date of Delivery: 03/26/24 being seen today for ongoing management of a high-risk pregnancy complicated by     ICD-10-CM   1. Supervision of high risk pregnancy in third trimester  O09.93     2. Fetal growth restriction antepartum: 2% EFW normal UAD  O36.5990     3. 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 Anxiety disorder, unspecified type  F41.9      .    Today she reports no complaints. Contractions: Irritability. Vag. Bleeding: None.  Movement: Present. denies leaking of fluid.      01/15/2024    2:06 PM 12/30/2023   11:47 AM 09/15/2023    2:35 PM  Depression screen PHQ 2/9  Decreased Interest 0 0 0  Down, Depressed, Hopeless 0 1 0  PHQ - 2 Score 0 1 0  Altered sleeping 3 3 0  Tired, decreased energy 3 3 3   Change in appetite 0 3 1  Feeling bad or failure about yourself  0 1 0  Trouble concentrating 1 3 1   Moving slowly or fidgety/restless 0 3 0  Suicidal thoughts 0 0 0  PHQ-9 Score 7 17  5       Data saved with a previous flowsheet row definition        01/15/2024    2:07 PM 12/30/2023   11:50 AM 09/15/2023    2:35 PM  GAD 7 : Generalized Anxiety Score  Nervous, Anxious, on Edge 1 3 0  Control/stop worrying 1 1 0  Worry too much - different things 0 3 0  Trouble relaxing 1 1 0  Restless 0 3 0  Easily annoyed or irritable 1 0 0  Afraid - awful might happen 1 3 2   Total GAD 7 Score 5 14 2      Review of Systems:   Pertinent items are noted in HPI Denies abnormal vaginal discharge w/ itching/odor/irritation, headaches, visual changes, shortness of breath, chest pain, abdominal pain, severe nausea/vomiting, or  problems with urination or bowel movements unless otherwise stated above. Pertinent History Reviewed:  Reviewed past medical,surgical, social, obstetrical and family history.  Reviewed problem list, medications and allergies. Physical Assessment:   Vitals:   01/19/24 1550  BP: 107/67  Pulse: 71  Weight: 175 lb 8 oz (79.6 kg)  Body mass index is 31.09 kg/m.           Physical Examination:   General appearance: alert, well appearing, and in no distress  Mental status: alert, oriented to person, place, and time  Skin: warm & dry   Extremities:      Cardiovascular: normal heart rate noted  Respiratory: normal respiratory effort, no distress  Abdomen: gravid, soft, non-tender  Pelvic: Cervical exam deferred         Fetal Status:     Movement: Present    Fetal Surveillance Testing today: BPP 8/8 with normal UAD  SSE cervical mucous no fluid   Chaperone: Aleck Blase    No results found for this or any previous visit (from the past 24 hours).  Assessment & Plan:  High-risk pregnancy: [id number] at [redacted]w[redacted]d with an Estimated  Date of Delivery: 03/26/24      ICD-10-CM   1. Supervision of high risk pregnancy in third trimester  O09.93     2. Fetal growth restriction antepartum: 2% EFW normal UAD  O36.5990     3. 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 Anxiety disorder, unspecified type  F41.9          Meds: No orders of the defined types were placed in this encounter.   Orders:  Orders Placed This Encounter  Procedures   MR BRAIN WO CONTRAST     Labs/procedures today: U/S  Treatment Plan:  see below    Follow-up: Return for keep scheduled.   Future Appointments  Date Time Provider Department Center  01/26/2024  2:15 PM [facility] US  [facility] None  01/26/2024  3:10 PM Marilynn Nest, DO [facility]  02/02/2024 10:45 AM [facility] - [facility]TO[facility]YN US  [facility]  None  02/02/2024 11:50 AM Marilynn Nest, DO [facility]  02/05/2024  9:50 AM [facility] NURSE [facility]  02/09/2024  9:15 AM [facility] - [facility]TOBGYN US  [facility] None  02/09/2024 10:10 AM Kizzie Suzen SAUNDERS, CNM [facility]  02/12/2024  9:30 AM [facility] NURSE [facility]  02/12/2024  1:45 PM [facility]  02/16/2024 10:45 AM [facility] - FT IMG 2 [facility] None  02/16/2024 11:30 AM Kizzie Suzen SAUNDERS, CNM [facility]  02/19/2024  9:50 AM [facility]FT[facility]GYN NURSE [facility]TO[facility]  02/23/2024  1:30 PM [facility] - FT IMG 2 [facility] None  02/23/2024  2:30 PM Jayne Vonn DEL, MD [facility]TOBGYN  03/01/2024 10:00 AM [facility] - FT IMG 2 [facility] None  03/01/2024 11:10 AM Kizzie Suzen SAUNDERS, CNM [facility]TO[facility]YN  03/08/2024 10:00 AM [facility] - FT IMG 2 [facility] None  03/11/2024 10:50 AM [facility]FTOBGYN NURSE [facility] FTOBGYN  03/15/2024 10:00 AM [facility] - FT IMG 2 [facility] None  03/18/2024  9:50 AM [facility]FTOBGYN NURSE [facility] FTOBGYN  03/22/2024 10:00 AM [facility] - FT IMG 2 [facility] None  03/25/2024  9:30 AM [facility]FT[facility] NURSE [facility] FTO[facility]YN    Orders Placed This Encounter  Procedures   MR BRAIN WO CONTRAST   Vonn DEL Jayne  Attending Physician for the Center for [facility] 01/19/2024 4:43 PM

## 2024-01-19 NOTE — Progress Notes (Signed)
 US  30+3 wks,cephalic,FHR 141 bpm,cx 3 cm,anterior placenta gr 2,AFI 17 cm,BPP 8/8,RI .64,.70=68%

## 2024-01-21 ENCOUNTER — Encounter: Payer: Self-pay | Admitting: Advanced Practice Midwife

## 2024-01-21 ENCOUNTER — Telehealth: Payer: Self-pay | Admitting: Obstetrics & Gynecology

## 2024-01-21 DIAGNOSIS — F419 Anxiety disorder, unspecified: Secondary | ICD-10-CM

## 2024-01-21 MED ORDER — HYDROXYZINE HCL 10 MG PO TABS: 10.0000 mg | ORAL_TABLET | Freq: Every evening | ORAL | 0 refills | Status: DC | PRN

## 2024-01-21 NOTE — Telephone Encounter (Signed)
-  called patient to review her concerns -pt notes that she is extra worried lately -reviewed her concerns regarding FGR.  Discussed close observation and twice weekly visit -plan to start on Atarax at night as needed - Also discussed that if she is ever concerned about movement or any other pregnancy problems she can always call the office or go down to [facility]U should it be an emergency - Questions and concerns were addressed - Follow-up as scheduled  Latasha Decock, DO Attending Obstetrician & Gynecologist, Faculty Practice Center for [employer], [facility]

## 2024-01-26 ENCOUNTER — Ambulatory Visit

## 2024-01-26 ENCOUNTER — Ambulatory Visit: Admitting: Obstetrics & Gynecology

## 2024-01-26 VITALS — BP 112/76 | HR 72 | Wt 177.0 lb

## 2024-01-26 DIAGNOSIS — G935 Compression of brain: Secondary | ICD-10-CM | POA: Diagnosis not present

## 2024-01-26 DIAGNOSIS — O99353 Diseases of the nervous system complicating pregnancy, third trimester: Secondary | ICD-10-CM | POA: Diagnosis not present

## 2024-01-26 DIAGNOSIS — O0993 Supervision of high risk pregnancy, unspecified, third trimester: Secondary | ICD-10-CM

## 2024-01-26 DIAGNOSIS — O365930 Maternal care for other known or suspected poor fetal growth, third trimester, not applicable or unspecified: Secondary | ICD-10-CM

## 2024-01-26 DIAGNOSIS — O26899 Other specified pregnancy related conditions, unspecified trimester: Secondary | ICD-10-CM

## 2024-01-26 DIAGNOSIS — Z3A31 31 weeks gestation of pregnancy: Secondary | ICD-10-CM

## 2024-01-26 DIAGNOSIS — Z6791 Unspecified blood type, Rh negative: Secondary | ICD-10-CM

## 2024-01-26 DIAGNOSIS — O365990 Maternal care for other known or suspected poor fetal growth, unspecified trimester, not applicable or unspecified: Secondary | ICD-10-CM

## 2024-01-26 NOTE — Progress Notes (Signed)
 HIGH-RISK PREGNANCY VISIT Patient name: Latasha Peters MRN [id number]  Date of birth: 31-Mar-2000 Chief Complaint:   Routine Prenatal Visit  History of Present Illness:   Latasha Peters is a 23 y.o. [id number] female at [redacted]w[redacted]d with an Estimated Date of Delivery: 03/26/24 being seen today for ongoing management of a high-risk pregnancy complicated by:  -FGR -RH neg  Today she reports no complaints.   Contractions: Not present. Vag. Bleeding: None.  Movement: Present. denies leaking of fluid.      01/15/2024    2:06 PM 12/30/2023   11:47 AM 09/15/2023    2:35 PM  Depression screen PHQ 2/9  Decreased Interest 0 0 0  Down, Depressed, Hopeless 0 1 0  PHQ - 2 Score 0 1 0  Altered sleeping 3 3 0  Tired, decreased energy 3 3 3   Change in appetite 0 3 1  Feeling bad or failure about yourself  0 1 0  Trouble concentrating 1 3 1   Moving slowly or fidgety/restless 0 3 0  Suicidal thoughts 0 0 0  PHQ-9 Score 7 17  5       Data saved with a previous flowsheet row definition     Current Outpatient Medications  Medication Instructions   acetaminophen  (TYLENOL ) 1,000 mg, Every 8 hours PRN   Blood Pressure Monitor MISC For regular home bp monitoring during pregnancy   hydrOXYzine  (ATARAX ) 10 mg, Oral, At bedtime PRN   Prenatal Vit-Fe Fumarate-FA (M-NATAL PLUS ) 27-1 MG TABS ONE TABLET AND ONE CAPSULE DAILY     Review of Systems:   Pertinent items are noted in HPI Denies abnormal vaginal discharge w/ itching/odor/irritation, headaches, visual changes, shortness of breath, chest pain, abdominal pain, severe nausea/vomiting, or problems with urination or bowel movements unless otherwise stated above. Pertinent History Reviewed:  Reviewed past medical,surgical, social, obstetrical and family history.  Reviewed problem list, medications and allergies. Physical Assessment:   Vitals:   01/26/24 1505  BP: 112/76  Pulse: 72  Weight: 177 lb (80.3 kg)  Body mass index is 31.35 kg/m.            Physical Examination:   General appearance: alert, well appearing, and in no distress  Mental status: normal mood, behavior, speech, dress, motor activity, and thought processes  Skin: warm & dry   Extremities:   no edema   Cardiovascular: normal heart rate noted  Respiratory: normal respiratory effort, no distress  Abdomen: gravid, soft, non-tender  Pelvic: Cervical exam deferred         Fetal Status:     Movement: Present    Fetal Surveillance Testing today: cephalic,FHR 138 bpm,anterior placenta gr 2,AFI 12 cm,BPP 8/8,EFW 1416 g 4%,AC 6%,RI .53,.66,.68=51%    Chaperone: N/A    No results found for this or any previous visit (from the past 24 hours).   Assessment & Plan:  High-risk pregnancy: [id number] at [redacted]w[redacted]d with an Estimated Date of Delivery: 03/26/24   1. Fetal growth restriction antepartum Growth improved to 4% BPP 8/8 Continue with weekly testing  2. 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 Supervision of high risk pregnancy in third trimester (Primary) Routine OB care  4. [redacted] weeks gestation of pregnancy   5. Rh negative state in antepartum period Baby RH+   Meds: No orders of the defined types were placed in this encounter.   Labs/procedures today: growth/BPP  Treatment Plan: Routine OB care and as indicated above  Reviewed: Preterm labor  symptoms and general obstetric precautions including but not limited to vaginal bleeding, contractions, leaking of fluid and fetal movement were reviewed in detail with the patient.  All questions were answered.    Follow-up: Return in about 1 week (around 02/02/2024) for HROB visit/antepartum testing- next week starts twice weekly.   Future Appointments  Date Time Provider Department Center  02/02/2024 10:45 AM [facility] - FTOBGYN US  [facility] None  02/02/2024 11:50 AM Marilynn Nest, DO [facility]YN  02/05/2024  9:50 AM [facility] NURSE [facility]  02/09/2024  9:15 AM  [facility] - FTOBGYN US  [facility] None  02/09/2024 10:10 AM Kizzie Suzen SAUNDERS, CNM [facility]  02/12/2024  9:30 AM [facility] NURSE [facility]  02/12/2024  1:45 PM [facility]  02/16/2024 10:45 AM [facility] - FT IMG 2 [facility] None  02/16/2024 11:30 AM Kizzie Suzen SAUNDERS, CNM [facility]  02/19/2024  9:50 AM [facility] NURSE [facility]  02/23/2024  1:30 PM [facility] - FT IMG 2 [facility] None  02/23/2024  2:30 PM Jayne Vonn DEL, MD [facility]TO[facility]YN  03/01/2024 10:00 AM [facility] - FT IMG 2 [facility] None  03/01/2024 11:10 AM Kizzie Suzen SAUNDERS, CNM [facility]TO[facility]  03/08/2024 10:00 AM [facility] - FT IMG 2 [facility] None  03/11/2024 10:50 AM [facility] NURSE [facility]  03/15/2024 10:00 AM [facility] - FT IMG 2 [facility] None  03/18/2024  9:50 AM [facility] NURSE [facility]TO[facility]YN  03/22/2024 10:00 AM [facility] - FT IMG 2 [facility] None  03/25/2024  9:30 AM [facility] NURSE [facility]TO[facility]YN    No orders of the defined types were placed in this encounter.   Jayleene Glaeser, DO Attending Obstetrician & Gynecologist, [facility] for [employer], [facility]

## 2024-01-26 NOTE — Progress Notes (Signed)
 US  31+3 wks,cephalic,FHR 138 bpm,anterior placenta gr 2,AFI 12 cm,BPP 8/8,EFW 1416 g 4%,AC 6%,RI .53,.66,.68=51%

## 2024-01-27 ENCOUNTER — Ambulatory Visit (HOSPITAL_COMMUNITY)

## 2024-01-27 ENCOUNTER — Encounter: Payer: Self-pay | Admitting: Obstetrics & Gynecology

## 2024-01-28 ENCOUNTER — Encounter: Payer: Self-pay | Admitting: Obstetrics & Gynecology

## 2024-02-02 ENCOUNTER — Ambulatory Visit: Admitting: Obstetrics & Gynecology

## 2024-02-02 ENCOUNTER — Encounter: Payer: Self-pay | Admitting: Obstetrics & Gynecology

## 2024-02-02 ENCOUNTER — Ambulatory Visit

## 2024-02-02 VITALS — BP 113/77 | HR 84 | Wt 177.2 lb

## 2024-02-02 DIAGNOSIS — Z3480 Encounter for supervision of other normal pregnancy, unspecified trimester: Secondary | ICD-10-CM

## 2024-02-02 DIAGNOSIS — O365990 Maternal care for other known or suspected poor fetal growth, unspecified trimester, not applicable or unspecified: Secondary | ICD-10-CM

## 2024-02-02 DIAGNOSIS — G935 Compression of brain: Secondary | ICD-10-CM

## 2024-02-02 DIAGNOSIS — Z3A32 32 weeks gestation of pregnancy: Secondary | ICD-10-CM

## 2024-02-02 DIAGNOSIS — O0993 Supervision of high risk pregnancy, unspecified, third trimester: Secondary | ICD-10-CM

## 2024-02-02 DIAGNOSIS — O365930 Maternal care for other known or suspected poor fetal growth, third trimester, not applicable or unspecified: Secondary | ICD-10-CM

## 2024-02-02 NOTE — Progress Notes (Signed)
 HIGH-RISK PREGNANCY VISIT Patient name: Latasha Peters MRN [id number]  Date of birth: 11/06/2000 Chief Complaint:   Routine Prenatal Visit  History of Present Illness:   Latasha Peters is a 23 y.o. [id number] female at [redacted]w[redacted]d with an Estimated Date of Delivery: 03/26/24 being seen today for ongoing management of a high-risk pregnancy complicated by:  -FGR -Rh neg -h/o anxiety/Depression- Atarax  prn  Today she reports no complaints.   Contractions: Not present. Vag. Bleeding: None.  Movement: Present. denies leaking of fluid.      01/15/2024    2:06 PM 12/30/2023   11:47 AM 09/15/2023    2:35 PM  Depression screen PHQ 2/9  Decreased Interest 0 0 0  Down, Depressed, Hopeless 0 1 0  PHQ - 2 Score 0 1 0  Altered sleeping 3 3 0  Tired, decreased energy 3 3 3   Change in appetite 0 3 1  Feeling bad or failure about yourself  0 1 0  Trouble concentrating 1 3 1   Moving slowly or fidgety/restless 0 3 0  Suicidal thoughts 0 0 0  PHQ-9 Score 7 17  5       Data saved with a previous flowsheet row definition     Current Outpatient Medications  Medication Instructions   acetaminophen  (TYLENOL ) 1,000 mg, Every 8 hours PRN   Blood Pressure Monitor MISC For regular home bp monitoring during pregnancy   hydrOXYzine  (ATARAX ) 10 mg, Oral, At bedtime PRN   Prenatal Vit-Fe Fumarate-FA (M-NATAL PLUS ) 27-1 MG TABS ONE TABLET AND ONE CAPSULE DAILY     Review of Systems:   Pertinent items are noted in HPI Denies abnormal vaginal discharge w/ itching/odor/irritation, headaches, visual changes, shortness of breath, chest pain, abdominal pain, severe nausea/vomiting, or problems with urination or bowel movements unless otherwise stated above. Pertinent History Reviewed:  Reviewed past medical,surgical, social, obstetrical and family history.  Reviewed problem list, medications and allergies. Physical Assessment:   Vitals:   02/02/24 1130  BP: 113/77  Pulse: 84  Weight: 177 lb 3.2 oz (80.4 kg)   Body mass index is 31.39 kg/m.           Physical Examination:   General appearance: alert, well appearing, and in no distress  Mental status: normal mood, behavior, speech, dress, motor activity, and thought processes  Skin: warm & dry   Extremities:   no edema   Cardiovascular: normal heart rate noted  Respiratory: normal respiratory effort, no distress  Abdomen: gravid, soft, non-tender  Pelvic: Cervical exam deferred         Fetal Status:     Movement: Present    Fetal Surveillance Testing today:  ,cephalic,BPP 8/8,anterior placenta gr 2,AFI 17 cm,FHR 146 bpm,RI .60,.67,.64=64%     Chaperone: N/A    No results found for this or any previous visit (from the past 24 hours).   Assessment & Plan:  High-risk pregnancy: [id number] at [redacted]w[redacted]d with an Estimated Date of Delivery: 03/26/24   1. [redacted] weeks gestation of pregnancy Routine OB care  2. Fetal growth restriction antepartum: 4% EFW normal UAD Normal BPP and dopplers today Continue antepartum screening and serial growth scans Reviewed plan for IOL pending fetal status  3. Supervision of high risk pregnancy in third trimester (Primary)   4. 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   Labs/procedures today: BPP/dopplers  Treatment Plan:  As outlined above and routine OB care  Reviewed: Preterm labor symptoms and general obstetric precautions including but not limited to vaginal bleeding, contractions, leaking of fluid and fetal movement were reviewed in detail with the patient.  All questions were answered. Pt has home bp cuff. Check bp weekly, let us  know if >140/90.   Follow-up: No follow-ups on file.   Future Appointments  Date Time Provider Department Center  02/02/2024 11:50 AM Marilynn Nest, DO [facility] FTOBGYN  02/05/2024  9:50 AM [facility] NURSE [facility]   02/09/2024  9:15 AM [facility] - FTOBGYN US  [facility] None  02/09/2024 10:10 AM Kizzie Suzen SAUNDERS, CNM [facility]  02/12/2024  9:30 AM [facility] NURSE [facility]  02/12/2024  1:45 PM [facility]  02/16/2024 10:45 AM [facility] - FT IMG 2 [facility] None  02/16/2024 11:30 AM Kizzie Suzen SAUNDERS, CNM [facility]TO[facility]YN  02/19/2024  9:50 AM [facility] NURSE [facility]  02/23/2024  1:30 PM [facility] - FT IMG 2 [facility] None  02/23/2024  2:30 PM Jayne Vonn DEL, MD [facility]TO[facility]  03/01/2024 10:00 AM [facility] - FT IMG 2 [facility] None  03/01/2024 11:10 AM Kizzie Suzen SAUNDERS, CNM [facility]TO[facility]  03/08/2024 10:00 AM [facility] - FT IMG 2 [facility] None  03/08/2024 11:10 AM Jayne Vonn DEL, MD [facility]TO[facility]  03/11/2024 10:50 AM [facility] NURSE [facility]  03/15/2024 10:00 AM [facility] - FT IMG 2 [facility] None  03/15/2024 10:50 AM Kizzie Suzen SAUNDERS, CNM [facility]  03/18/2024  9:50 AM [facility] NURSE [facility]  03/22/2024 10:00 AM [facility] - FT IMG 2 [facility] None  03/22/2024 10:50 AM Jayne Vonn DEL, MD [facility]  03/25/2024  9:30 AM [facility] NURSE [facility]    No orders of the defined types were placed in this encounter.   Rien Marland, DO Attending Obstetrician & Gynecologist, [facility] for [employer], [facility]

## 2024-02-02 NOTE — Progress Notes (Signed)
 US  32+3 wks,cephalic,BPP 8/8,anterior placenta gr 2,AFI 17 cm,FHR 146 bpm,RI .60,.67,.64=64%

## 2024-02-03 ENCOUNTER — Encounter: Payer: Self-pay | Admitting: Obstetrics & Gynecology

## 2024-02-03 ENCOUNTER — Inpatient Hospital Stay (HOSPITAL_COMMUNITY)

## 2024-02-03 ENCOUNTER — Encounter (HOSPITAL_COMMUNITY): Payer: Self-pay | Admitting: Obstetrics and Gynecology

## 2024-02-03 ENCOUNTER — Other Ambulatory Visit: Payer: Self-pay

## 2024-02-03 ENCOUNTER — Inpatient Hospital Stay (HOSPITAL_COMMUNITY)
Admission: AD | Admit: 2024-02-03 | Discharge: 2024-02-04 | Disposition: A | Discharge status: Home / Self Care | Attending: Obstetrics and Gynecology | Admitting: Obstetrics and Gynecology

## 2024-02-03 DIAGNOSIS — O365930 Maternal care for other known or suspected poor fetal growth, third trimester, not applicable or unspecified: Secondary | ICD-10-CM | POA: Insufficient documentation

## 2024-02-03 DIAGNOSIS — R519 Headache, unspecified: Secondary | ICD-10-CM | POA: Diagnosis present

## 2024-02-03 DIAGNOSIS — Z3689 Encounter for other specified antenatal screening: Secondary | ICD-10-CM

## 2024-02-03 DIAGNOSIS — O26893 Other specified pregnancy related conditions, third trimester: Secondary | ICD-10-CM | POA: Insufficient documentation

## 2024-02-03 DIAGNOSIS — Z3A32 32 weeks gestation of pregnancy: Secondary | ICD-10-CM | POA: Insufficient documentation

## 2024-02-03 LAB — URINALYSIS, ROUTINE W REFLEX MICROSCOPIC
Bilirubin Urine: NEGATIVE
Glucose, UA: NEGATIVE mg/dL
Hgb urine dipstick: NEGATIVE
Ketones, ur: NEGATIVE mg/dL
Leukocytes,Ua: NEGATIVE
Nitrite: NEGATIVE
Protein, ur: NEGATIVE mg/dL
Specific Gravity, Urine: 1.013 (ref 1.005–1.030)
pH: 6 (ref 5.0–8.0)

## 2024-02-03 MED ORDER — PROCHLORPERAZINE MALEATE 5 MG PO TABS
5.0000 mg | ORAL_TABLET | Freq: Once | ORAL | Status: AC
  Administered 2024-02-03: 5 mg via ORAL
  Filled 2024-02-03: qty 1

## 2024-02-03 MED ORDER — METOCLOPRAMIDE HCL 10 MG PO TABS
10.0000 mg | ORAL_TABLET | Freq: Once | ORAL | Status: AC
  Administered 2024-02-03: 10 mg via ORAL
  Filled 2024-02-03: qty 1

## 2024-02-03 MED ORDER — SUMATRIPTAN SUCCINATE 50 MG PO TABS
50.0000 mg | ORAL_TABLET | Freq: Once | ORAL | Status: AC
  Administered 2024-02-03: 50 mg via ORAL
  Filled 2024-02-03: qty 1

## 2024-02-03 MED ORDER — ACETAMINOPHEN-CAFFEINE 500-65 MG PO TABS
2.0000 | ORAL_TABLET | Freq: Once | ORAL | Status: AC
  Administered 2024-02-03: 2 via ORAL
  Filled 2024-02-03: qty 2

## 2024-02-03 MED ORDER — CYCLOBENZAPRINE HCL 5 MG PO TABS
10.0000 mg | ORAL_TABLET | Freq: Once | ORAL | Status: AC
  Administered 2024-02-03: 10 mg via ORAL
  Filled 2024-02-03: qty 2

## 2024-02-03 NOTE — MAU Note (Signed)
 Latasha Peters is a 23 y.o. at [redacted]w[redacted]d here in MAU reporting: she began seeing spots and checked BP, reports BP's were 120/75 and 131/89.  States she currently has a HA, took 1000 mg Tylenol  at noon, no relief noted.  Also reports intermittent visual disturbances, seeing spots and epigastric pain (earlier, stabbing pain.)  Denies VB and LOF.  Reports +FM, less than normal.  LMP: 05/25/2023 Onset of complaint: today Pain score: 7 Vitals:   02/03/24 1820  BP: 120/73  Pulse: (!) 108  Resp: 20  Temp: 98.4 F (36.9 C)  SpO2: 99%     FHT: 154 bpm  Lab orders placed from triage: UA

## 2024-02-03 NOTE — MAU Provider Note (Incomplete Revision)
 History     CSN: [id number]  Arrival date and time: 02/03/24 1758   Event Date/Time   First Provider Initiated Contact with Patient 02/03/24 1859      Chief Complaint  Patient presents with   BP Evaluation   Headache   Latasha Peters is a G4P2012 at [redacted]w[redacted]d presenting with HA and concerns for elevated blood pressures at home but highest was 130s/80s. Pregnancy complicated by known FGR  HA started last night about 5 PM. Notes it was present with sleeping, waking. Was 10/10 yesterday evening. Currently 5/10 pain. Reports last tylenol  at noon.   Patient has a known chiari malformation.   Headache  This is a new problem. The current episode started today. The problem occurs constantly. The problem has been unchanged. The pain is located in the Frontal region. The pain does not radiate. The pain quality is not similar to prior headaches. The quality of the pain is described as aching. The pain is at a severity of 5/10. The pain is moderate. Associated symptoms include a visual change (seeing whtie spots with black dot in the middle). Pertinent negatives include no abdominal pain, back pain, blurred vision, fever, nausea, numbness or vomiting. She has tried acetaminophen  for the symptoms. The treatment provided mild relief. There is no history of hypertension, migraine headaches or pseudotumor cerebri.     OB History     Gravida  4   Para  2   Term  2   Preterm      AB  1   Living  2      SAB  1   IAB      Ectopic      Multiple      Live Births  2           Past Medical History:  Diagnosis Date   Anemia    Chiari malformation type I (HCC) 2021    Past Surgical History:  Procedure Laterality Date   TONSILLECTOMY  2010    Family History  Problem Relation Age of Onset   Hypertension Father     Social History   Tobacco Use   Smoking status: Former   Smokeless tobacco: Never  Vaping Use   Vaping status: Former  Substance Use Topics   Alcohol use: Never    Drug use: Never    Allergies:  Allergies  Allergen Reactions   Latex Itching and Rash    Medications Prior to Admission  Medication Sig Dispense Refill Last Dose/Taking   acetaminophen  (TYLENOL ) 650 MG CR tablet Take 1,000 mg by mouth every 8 (eight) hours as needed for pain.   02/03/2024 at 12:00 PM   Prenatal Vit-Fe Fumarate-FA (M-NATAL PLUS ) 27-1 MG TABS ONE TABLET AND ONE CAPSULE DAILY 90 tablet 3 02/02/2024   Blood Pressure Monitor MISC For regular home bp monitoring during pregnancy (Patient not taking: Reported on 01/19/2024) 1 each 0    hydrOXYzine  (ATARAX ) 10 MG tablet Take 1 tablet (10 mg total) by mouth at bedtime as needed for anxiety. 30 tablet 0     Review of Systems  Constitutional:  Negative for fever.  Eyes:  Negative for blurred vision.  Gastrointestinal:  Negative for abdominal pain, nausea and vomiting.  Musculoskeletal:  Negative for back pain.  Neurological:  Positive for headaches. Negative for numbness.   Physical Exam   Blood pressure (!) 104/57, pulse 69, temperature 98.4 F (36.9 C), temperature source Oral, resp. rate 20, height 5' 3 (1.6 m), weight 82.1 kg,  last menstrual period 05/25/2023, SpO2 99%, unknown if currently breastfeeding.  Patient Vitals for the past 24 hrs:  BP Temp Temp src Pulse Resp SpO2 Height Weight  02/03/24 2001 (!) 104/57 -- -- 67 -- -- -- --  02/03/24 2000 -- -- -- -- -- 99 % -- --  02/03/24 1950 -- -- -- -- -- 100 % -- --  02/03/24 1946 112/69 -- -- 83 -- -- -- --  02/03/24 1931 104/63 -- -- 84 -- -- -- --  02/03/24 1916 111/70 -- -- 86 -- -- -- --  02/03/24 1905 -- -- -- -- -- 99 % -- --  02/03/24 1901 117/65 -- -- 91 -- -- -- --  02/03/24 1845 115/70 -- -- (!) 102 -- 98 % -- --  02/03/24 1842 114/65 -- -- 97 -- -- -- --  02/03/24 1820 120/73 98.4 F (36.9 C) Oral (!) 108 20 99 % -- --  02/03/24 1813 -- -- -- -- -- -- 5' 3 (1.6 m) 82.1 kg    Physical Exam Vitals and nursing note reviewed.  Constitutional:       Appearance: Normal appearance.  HENT:     Head: Normocephalic and atraumatic.     Nose: Nose normal.     Mouth/Throat:     Mouth: Mucous membranes are moist.  Eyes:     Conjunctiva/sclera: Conjunctivae normal.  Cardiovascular:     Rate and Rhythm: Normal rate.  Pulmonary:     Effort: Pulmonary effort is normal.  Abdominal:     General: Abdomen is flat.     Palpations: Abdomen is soft.  Musculoskeletal:     Cervical back: Normal range of motion.  Skin:    General: Skin is warm.     Capillary Refill: Capillary refill takes less than 2 seconds.  Neurological:     General: No focal deficit present.     Mental Status: She is alert and oriented to person, place, and time.     GCS: GCS eye subscore is 4. GCS verbal subscore is 5. GCS motor subscore is 6.     Cranial Nerves: Cranial nerves 2-12 are intact.     Motor: Motor function is intact.     Coordination: Coordination is intact.  Psychiatric:        Mood and Affect: Mood normal.     MAU Course  Procedures NST; 150/moderate/+accels no decels Reactive   MDM: Moderate  Ddx for HA includes tension, migraines, increased intercerebral pressure due to know Chiari malformation. Neuro exam is reassuring against this. Most likely migrainous vs simple tension that has not be adequately treated.   Meds ordered this encounter  Medications   acetaminophen -caffeine (EXCEDRIN TENSION HEADACHE) 500-65 MG per tablet 2 tablet   cyclobenzaprine  (FLEXERIL ) tablet 10 mg   prochlorperazine (COMPAZINE) tablet 5 mg   SUMAtriptan (IMITREX) tablet 50 mg   metoCLOPramide (REGLAN) tablet 10 mg   8:37 PM HA is worse now- located in left eye. Sitting in dark room.  Will get CT given worsening HA.  Will give Sumatriptan and reglan.   10:08 PM Improved HA, 4/10 currently and eating  Assessment and Plan   1. Pregnancy headache in third trimester   2. [redacted] weeks gestation of pregnancy   3. NST (non-stress test) reactive     Latasha Peters 02/03/2024, 9:58 PM

## 2024-02-03 NOTE — MAU Provider Note (Signed)
 History     CSN: [id number]  Arrival date and time: 02/03/24 1758   Event Date/Time   First Provider Initiated Contact with Patient 02/03/24 1859      Chief Complaint  Patient presents with   BP Evaluation   Headache   Latasha Peters is a G4P2012 at [redacted]w[redacted]d presenting with HA and concerns for elevated blood pressures at home but highest was 130s/80s. Pregnancy complicated by known FGR  HA started last night about 5 PM. Notes it was present with sleeping, waking. Was 10/10 yesterday evening. Currently 5/10 pain. Reports last tylenol  at noon.   Patient has a known chiari malformation.   Headache  This is a new problem. The current episode started today. The problem occurs constantly. The problem has been unchanged. The pain is located in the Frontal region. The pain does not radiate. The pain quality is not similar to prior headaches. The quality of the pain is described as aching. The pain is at a severity of 5/10. The pain is moderate. Associated symptoms include a visual change (seeing whtie spots with black dot in the middle). Pertinent negatives include no abdominal pain, back pain, blurred vision, fever, nausea, numbness or vomiting. She has tried acetaminophen  for the symptoms. The treatment provided mild relief. There is no history of hypertension, migraine headaches or pseudotumor cerebri.     OB History     Gravida  4   Para  2   Term  2   Preterm      AB  1   Living  2      SAB  1   IAB      Ectopic      Multiple      Live Births  2           Past Medical History:  Diagnosis Date   Anemia    Chiari malformation type I (HCC) 2021    Past Surgical History:  Procedure Laterality Date   TONSILLECTOMY  2010    Family History  Problem Relation Age of Onset   Hypertension Father     Social History   Tobacco Use   Smoking status: Former   Smokeless tobacco: Never  Vaping Use   Vaping status: Former  Substance Use Topics   Alcohol use: Never    Drug use: Never    Allergies:  Allergies  Allergen Reactions   Latex Itching and Rash    Medications Prior to Admission  Medication Sig Dispense Refill Last Dose/Taking   acetaminophen  (TYLENOL ) 650 MG CR tablet Take 1,000 mg by mouth every 8 (eight) hours as needed for pain.   02/03/2024 at 12:00 PM   Prenatal Vit-Fe Fumarate-FA (M-NATAL PLUS ) 27-1 MG TABS ONE TABLET AND ONE CAPSULE DAILY 90 tablet 3 02/02/2024   Blood Pressure Monitor MISC For regular home bp monitoring during pregnancy (Patient not taking: Reported on 01/19/2024) 1 each 0    hydrOXYzine  (ATARAX ) 10 MG tablet Take 1 tablet (10 mg total) by mouth at bedtime as needed for anxiety. 30 tablet 0     Review of Systems  Constitutional:  Negative for fever.  Eyes:  Negative for blurred vision.  Gastrointestinal:  Negative for abdominal pain, nausea and vomiting.  Musculoskeletal:  Negative for back pain.  Neurological:  Positive for headaches. Negative for numbness.   Physical Exam   Blood pressure (!) 104/57, pulse 69, temperature 98.4 F (36.9 C), temperature source Oral, resp. rate 20, height 5' 3 (1.6 m), weight 82.1 kg,  last menstrual period 05/25/2023, SpO2 99%, unknown if currently breastfeeding.  Patient Vitals for the past 24 hrs:  BP Temp Temp src Pulse Resp SpO2 Height Weight  02/03/24 2001 (!) 104/57 -- -- 67 -- -- -- --  02/03/24 2000 -- -- -- -- -- 99 % -- --  02/03/24 1950 -- -- -- -- -- 100 % -- --  02/03/24 1946 112/69 -- -- 83 -- -- -- --  02/03/24 1931 104/63 -- -- 84 -- -- -- --  02/03/24 1916 111/70 -- -- 86 -- -- -- --  02/03/24 1905 -- -- -- -- -- 99 % -- --  02/03/24 1901 117/65 -- -- 91 -- -- -- --  02/03/24 1845 115/70 -- -- (!) 102 -- 98 % -- --  02/03/24 1842 114/65 -- -- 97 -- -- -- --  02/03/24 1820 120/73 98.4 F (36.9 C) Oral (!) 108 20 99 % -- --  02/03/24 1813 -- -- -- -- -- -- 5' 3 (1.6 m) 82.1 kg    Physical Exam Vitals and nursing note reviewed.  Constitutional:       Appearance: Normal appearance.  HENT:     Head: Normocephalic and atraumatic.     Nose: Nose normal.     Mouth/Throat:     Mouth: Mucous membranes are moist.  Eyes:     Conjunctiva/sclera: Conjunctivae normal.  Cardiovascular:     Rate and Rhythm: Normal rate.  Pulmonary:     Effort: Pulmonary effort is normal.  Abdominal:     General: Abdomen is flat.     Palpations: Abdomen is soft.  Musculoskeletal:     Cervical back: Normal range of motion.  Skin:    General: Skin is warm.     Capillary Refill: Capillary refill takes less than 2 seconds.  Neurological:     General: No focal deficit present.     Mental Status: She is alert and oriented to person, place, and time.     GCS: GCS eye subscore is 4. GCS verbal subscore is 5. GCS motor subscore is 6.     Cranial Nerves: Cranial nerves 2-12 are intact.     Motor: Motor function is intact.     Coordination: Coordination is intact.  Psychiatric:        Mood and Affect: Mood normal.     MAU Course  Procedures NST; 150/moderate/+accels no decels Reactive   MDM: Moderate  Ddx for HA includes tension, migraines, increased intercerebral pressure due to know Chiari malformation. Neuro exam is reassuring against this. Most likely migrainous vs simple tension that has not be adequately treated.   Meds ordered this encounter  Medications   acetaminophen -caffeine (EXCEDRIN TENSION HEADACHE) 500-65 MG per tablet 2 tablet   cyclobenzaprine  (FLEXERIL ) tablet 10 mg   prochlorperazine (COMPAZINE) tablet 5 mg   SUMAtriptan (IMITREX) tablet 50 mg   metoCLOPramide (REGLAN) tablet 10 mg   8:37 PM HA is worse now- located in left eye. Sitting in dark room.  Will get CT given worsening HA.  Will give Sumatriptan and reglan.   10:08 PM Improved HA, 4/10 currently and eating  Assessment and Plan   1. Pregnancy headache in third trimester   2. [redacted] weeks gestation of pregnancy   3. NST (non-stress test) reactive     Latasha Peters 02/03/2024, 9:58 PM  Olam Boards, CNM 3:49 AM

## 2024-02-05 ENCOUNTER — Ambulatory Visit

## 2024-02-05 VITALS — BP 114/78 | HR 111

## 2024-02-05 DIAGNOSIS — O365990 Maternal care for other known or suspected poor fetal growth, unspecified trimester, not applicable or unspecified: Secondary | ICD-10-CM

## 2024-02-05 DIAGNOSIS — Z3A32 32 weeks gestation of pregnancy: Secondary | ICD-10-CM

## 2024-02-05 NOTE — Progress Notes (Signed)
   NURSE VISIT- NST  SUBJECTIVE:  Latasha Peters is a 23 y.o. [phone] female at [redacted]w[redacted]d, here for a NST for pregnancy complicated by FGR.  She reports active fetal movement, contractions: none, vaginal bleeding: none, membranes: intact.   OBJECTIVE:  BP 114/78   Pulse (!) 111   LMP 05/25/2023 (Approximate)   Appears well, no apparent distress  No results found for this or any previous visit (from the past 24 hours).  NST: FHR baseline 155 bpm, Variability: moderate, Accelerations:present, Decelerations:  Absent= Cat 1/reactive Toco: none   ASSESSMENT: [id number] at [redacted]w[redacted]d with FGR NST reactive  PLAN: EFM strip reviewed by Dr. Ozan   Recommendations: keep next appointment as scheduled    Latasha Peters  02/05/2024 10:37 AM

## 2024-02-09 ENCOUNTER — Encounter: Payer: Self-pay | Admitting: Women's Health

## 2024-02-09 ENCOUNTER — Ambulatory Visit: Admitting: Women's Health

## 2024-02-09 ENCOUNTER — Ambulatory Visit

## 2024-02-09 VITALS — BP 107/73 | HR 88 | Wt 180.2 lb

## 2024-02-09 DIAGNOSIS — O365930 Maternal care for other known or suspected poor fetal growth, third trimester, not applicable or unspecified: Secondary | ICD-10-CM

## 2024-02-09 DIAGNOSIS — O0993 Supervision of high risk pregnancy, unspecified, third trimester: Secondary | ICD-10-CM

## 2024-02-09 DIAGNOSIS — Z3480 Encounter for supervision of other normal pregnancy, unspecified trimester: Secondary | ICD-10-CM

## 2024-02-09 DIAGNOSIS — Z3A33 33 weeks gestation of pregnancy: Secondary | ICD-10-CM

## 2024-02-09 MED ORDER — BUTALBITAL-APAP-CAFFEINE 50-325-40 MG PO TABS: 1.0000 | ORAL_TABLET | ORAL | 0 refills | Status: AC | PRN

## 2024-02-09 NOTE — Patient Instructions (Addendum)
 Aarian, thank you for choosing our office today! We appreciate the opportunity to meet your healthcare needs. You may receive a short survey by mail, e-mail, or through [employer]. If you are happy with your care we would appreciate if you could take just a few minutes to complete the survey questions. We read all of your comments and take your feedback very seriously. Thank you again for choosing our office.  Center for [employer] Team at [facility]  [facility] at [facility] ([address] [place], [place] [address]) Entrance C, located off of [address] Free 24/7 valet parking   CLASSES: Go to [email or web address] to register for classes (childbirth, breastfeeding, waterbirth, infant CPR, daddy bootcamp, etc.)  Call the office [phone]) or go to [facility] if: You begin to have strong, frequent contractions Your water breaks.  Sometimes it is a big gush of fluid, sometimes it is just a trickle that keeps getting your panties wet or running down your legs You have vaginal bleeding.  It is normal to have a small amount of spotting if your cervix was checked.  You don't feel your baby moving like normal.  If you don't, get you something to eat and drink and lay down and focus on feeling your baby move.   If your baby is still not moving like normal, you should call the office or go to [facility].  Call the office [phone]) or go to [facility] hospital for these signs of pre-eclampsia: Severe headache that does not go away with Tylenol  Visual changes- seeing spots, double, blurred vision Pain under your right breast or upper abdomen that does not go away with Tums or heartburn medicine Nausea and/or vomiting Severe swelling in your hands, feet, and face   For Headaches:  Stay well hydrated, drink enough water so that your urine is clear, sometimes if you are dehydrated you can get headaches Eat small frequent meals and snacks, sometimes if you are hungry you  can get headaches Sometimes you get headaches during pregnancy from the pregnancy hormones You can try tylenol  (1-2 regular strength 325mg  or 1-2 extra strength 500mg ) as directed on the box. The least amount of medication that works is best.  Cool compresses (cool wet washcloth or ice pack) to area of head that is hurting You can also try drinking a caffeinated drink to see if this will help If not helping, try below:  For Prevention of Headaches/Migraines: CoQ10 100mg  three times daily Vitamin B2 400mg  daily Magnesium Oxide 400-600mg  daily  Foods to alleviate migraines:  1) dark leafy greens 2) avocado 3) tuna 4) salmon  5) beans and legumes  Foods to avoid: 1) Excessive (or irregular timing) coffee 3) aged cheeses 4) chocolate 5) citrus fruits 6) aspartame and other artifical sweeteners 7) yeast 8) MSG (in processed foods) 9) processed and cured meats 10) nuts and certain seeds 11) chicken livers and other organ meats 12) dairy products like buttermilk, sour cream, and yogurt 13) dried fruits like dates, figs, and raisins 14) garlic 15) onions 16) potato chips 17) pickled foods like olives and sauerkraut 18) some fresh fruits like ripe banana, papaya, red plums, raspberries, kiwi, pineapple 19) tomato-based products  Recommend to keep a migraine diary: rate daily the severity of your headache (1-10) and what foods you eat that day to help determine patterns.   If You Get a Bad Headache/Migraine: Benadryl 25mg   Magnesium Oxide 1 large Gatorade 2 extra strength Tylenol  (1,000mg  total) 1  cup coffee or Coke      If this doesn't help please call us  @ [phone]   Tdap Vaccine It is recommended that you get the Tdap vaccine during the third trimester of EACH pregnancy to help protect your baby from getting pertussis (whooping cough) 27-36 weeks is the BEST time to do this so that you can pass the protection on to your baby. During pregnancy is better than after  pregnancy, but if you are unable to get it during pregnancy it will be offered at the hospital.  You can get this vaccine with us , at the health department, your family doctor, or some local pharmacies Everyone who will be around your baby should also be up-to-date on their vaccines before the baby comes. Adults (who are not pregnant) only need 1 dose of Tdap during adulthood.   [facility] Pediatricians/Family Doctors [facility]): [address] Luba BROCKS, [phone]           [facility]: [address], [phone]                [facility]): [address], [phone] (call to ask if accepting patients) [facility] Department: [address], [place], [phone]    [facility] Pediatricians/Family Doctors [facility]): [phone] [address], [phone] [facility]: [address] [place], [phone] [facility]: [address], [phone]  [facility] Doctors  [facility]): [phone] [facility]: [address] [phone]   [facility] Doctors [facility]: [address], [phone]  [facility] Doctors  [facility]: [phone], [phone]  Home Blood Pressure Monitoring for Patients   Your provider has recommended that you check your blood pressure (BP) at least once a week at home. If you do not have a blood pressure cuff at home, one will be provided for you. Contact your provider if you have not received your monitor within 1 week.   Helpful Tips for Accurate Home Blood Pressure Checks  Don't smoke, exercise, or drink caffeine  30 minutes before checking your BP Use the restroom before checking your BP (a full bladder can raise your pressure) Relax in a comfortable upright chair Feet on the ground Left arm resting comfortably  on a flat surface at the level of your heart Legs uncrossed Back supported Sit quietly and don't talk Place the cuff on your bare arm Adjust snuggly, so that only two fingertips can fit between your skin and the top of the cuff Check 2 readings separated by at least one minute Keep a log of your BP readings For a visual, please reference this diagram: http://ccnc.care/bpdiagram  Provider Name: [facility]     Phone: [phone]  Zone 1: ALL CLEAR  Continue to monitor your symptoms:  BP reading is less than 140 (top number) or less than 90 (bottom number)  No right upper stomach pain No headaches or seeing spots No feeling nauseated or throwing up No swelling in face and hands  Zone 2: CAUTION Call your doctor's office for any of the following:  BP reading is greater than 140 (top number) or greater than 90 (bottom number)  Stomach pain under your ribs in the middle or right side Headaches or seeing spots Feeling nauseated or throwing up Swelling in face and hands  Zone 3: EMERGENCY  Seek immediate medical care if  you have any of the following:  BP reading is greater than160 (top number) or greater than 110 (bottom number) Severe headaches not improving with Tylenol  Serious difficulty catching your breath Any worsening symptoms from Zone 2  Preterm Labor and Birth Information  The normal length of a pregnancy is 39-41 weeks. Preterm labor is when labor starts before 37 completed weeks of pregnancy. What are the risk factors for preterm labor? Preterm labor is more likely to occur in women who: Have certain infections during pregnancy such as a bladder infection, sexually transmitted infection, or infection inside the uterus (chorioamnionitis). Have a shorter-than-normal cervix. Have gone into preterm labor before. Have had surgery on their cervix. Are younger than age 54 or older than age 55. Are African American. Are pregnant with twins or multiple babies  (multiple gestation). Take street drugs or smoke while pregnant. Do not gain enough weight while pregnant. Became pregnant shortly after having been pregnant. What are the symptoms of preterm labor? Symptoms of preterm labor include: Cramps similar to those that can happen during a menstrual period. The cramps may happen with diarrhea. Pain in the abdomen or lower back. Regular uterine contractions that may feel like tightening of the abdomen. A feeling of increased pressure in the pelvis. Increased watery or bloody mucus discharge from the vagina. Water breaking (ruptured amniotic sac). Why is it important to recognize signs of preterm labor? It is important to recognize signs of preterm labor because babies who are born prematurely may not be fully developed. This can put them at an increased risk for: Long-term (chronic) heart and lung problems. Difficulty immediately after birth with regulating body systems, including blood sugar, body temperature, heart rate, and breathing rate. Bleeding in the brain. Cerebral palsy. Learning difficulties. Death. These risks are highest for babies who are born before 34 weeks of pregnancy. How is preterm labor treated? Treatment depends on the length of your pregnancy, your condition, and the health of your baby. It may involve: Having a stitch (suture) placed in your cervix to prevent your cervix from opening too early (cerclage). Taking or being given medicines, such as: Hormone medicines. These may be given early in pregnancy to help support the pregnancy. Medicine to stop contractions. Medicines to help mature the baby's lungs. These may be prescribed if the risk of delivery is high. Medicines to prevent your baby from developing cerebral palsy. If the labor happens before 34 weeks of pregnancy, you may need to stay in the hospital. What should I do if I think I am in preterm labor? If you think that you are going into preterm labor, call your  health care provider right away. How can I prevent preterm labor in future pregnancies? To increase your chance of having a full-term pregnancy: Do not use any tobacco products, such as cigarettes, chewing tobacco, and e-cigarettes. If you need help quitting, ask your health care provider. Do not use street drugs or medicines that have not been prescribed to you during your pregnancy. Talk with your health care provider before taking any herbal supplements, even if you have been taking them regularly. Make sure you gain a healthy amount of weight during your pregnancy. Watch for infection. If you think that you might have an infection, get it checked right away. Make sure to tell your health care provider if you have gone into preterm labor before. This information is not intended to replace advice given to you by your health care provider. Make sure you discuss any  questions you have with your health care provider. Document Revised: 06/12/2018 Document Reviewed: 07/12/2015 Elsevier Patient Education  2020 [employer].

## 2024-02-09 NOTE — Progress Notes (Signed)
 US  33+3 wks,cephalic,BPP 8/8,anterior placenta gr 2,FHR 141 bpm,AFI 16 cm,RI .51,.53,.58=22%

## 2024-02-09 NOTE — Progress Notes (Signed)
 HIGH-RISK PREGNANCY VISIT Patient name: Latasha Peters MRN [id number]  Date of birth: June 02, 2000 Chief Complaint:   Routine Prenatal Visit  History of Present Illness:   Latasha Peters is a 23 y.o. [id number] female at [redacted]w[redacted]d with an Estimated Date of Delivery: 03/26/24 being seen today for ongoing management of a high-risk pregnancy complicated by fetal growth restriction 4% w/ normal UAD.    Today she reports persistent headache, went to [facility] 12[facility], head CT no acute intracranial abnormality, low-lying cerebellar tonsils approximately 6mm below foramen magnum-has known chiari malformation. Taking excedrin tension w/o relief.  Contractions: Not present. Vag. Bleeding: None.  Movement: Present. denies leaking of fluid.      01/15/2024    2:06 PM 12/30/2023   11:47 AM 09/15/2023    2:35 PM  Depression screen PHQ 2/9  Decreased Interest 0 0 0  Down, Depressed, Hopeless 0 1 0  PHQ - 2 Score 0 1 0  Altered sleeping 3 3 0  Tired, decreased energy 3 3 3   Change in appetite 0 3 1  Feeling bad or failure about yourself  0 1 0  Trouble concentrating 1 3 1   Moving slowly or fidgety/restless 0 3 0  Suicidal thoughts 0 0 0  PHQ-9 Score 7 17  5       Data saved with a previous flowsheet row definition        01/15/2024    2:07 PM 12/30/2023   11:50 AM 09/15/2023    2:35 PM  GAD 7 : Generalized Anxiety Score  Nervous, Anxious, on Edge 1 3 0  Control/stop worrying 1 1 0  Worry too much - different things 0 3 0  Trouble relaxing 1 1 0  Restless 0 3 0  Easily annoyed or irritable 1 0 0  Afraid - awful might happen 1 3 2   Total GAD 7 Score 5 14 2      Review of Systems:   Pertinent items are noted in HPI Denies abnormal vaginal discharge w/ itching/odor/irritation, headaches, visual changes, shortness of breath, chest pain, abdominal pain, severe nausea/vomiting, or problems with urination or bowel movements unless otherwise stated above. Pertinent History Reviewed:  Reviewed past  medical,surgical, social, obstetrical and family history.  Reviewed problem list, medications and allergies. Physical Assessment:   Vitals:   02/09/24 0949  BP: 107/73  Pulse: 88  Weight: 180 lb 3.2 oz (81.7 kg)  Body mass index is 31.92 kg/m.           Physical Examination:   General appearance: alert, well appearing, and in no distress  Mental status: alert, oriented to person, place, and time  Skin: warm & dry   Extremities:      Cardiovascular: normal heart rate noted  Respiratory: normal respiratory effort, no distress  Abdomen: gravid, soft, non-tender  Pelvic: Cervical exam deferred         Fetal Status:     Movement: Present    Fetal Surveillance Testing today: US  33+3 wks,cephalic,BPP 8/8,anterior placenta gr 2,FHR 141 bpm,AFI 16 cm,RI .51,.53,.58=22%   Chaperone: N/A  No results found for this or any previous visit (from the past 24 hours).  Assessment & Plan:  High-risk pregnancy: [id number] at [redacted]w[redacted]d with an Estimated Date of Delivery: 03/26/24   1) FGR 4% w/ normal UAD, bpp 8/8 today  2) Chiari malformation  3) Persistent headache> bp great, excedrin tension not helping. Rx fioricet, gave headache info, let us  know if not improving  Meds:  Meds ordered this encounter  Medications   butalbital -acetaminophen -caffeine  (FIORICET) 50-325-40 MG tablet    Sig: Take 1 tablet by mouth every 4 (four) hours as needed for headache or migraine.    Dispense:  20 tablet    Refill:  0    Labs/procedures today: U/S  Treatment Plan:    UAD EFW Testing Delivery  EFW/AC 3-9% Q 1-2wk Q 3wk 28w-weekly BPP 32w-add NST 38.0-39.0    Reviewed: Preterm labor symptoms and general obstetric precautions including but not limited to vaginal bleeding, contractions, leaking of fluid and fetal movement were reviewed in detail with the patient.  All questions were answered. Does have home bp cuff. Office bp cuff given: not applicable. Check bp weekly, let us  know if consistently >140  and/or >90.  Follow-up: Return for As scheduled.   Future Appointments  Date Time Provider Department Center  02/12/2024  9:30 AM [facility] NURSE [facility]  02/12/2024  1:45 PM [facility]  02/16/2024 10:45 AM [facility] IMG 2 [facility] None  02/16/2024 11:30 AM Kizzie Suzen SAUNDERS, CNM [facility]  02/19/2024  9:50 AM [facility] NURSE [facility]  02/23/2024  1:30 PM [facility] IMG 2 [facility] None  02/23/2024  2:30 PM Jayne Vonn DEL, MD [facility]  03/01/2024 10:00 AM [facility] IMG 2 [facility] None  03/01/2024 11:10 AM Kizzie Suzen SAUNDERS, CNM [facility]  03/08/2024 10:00 AM [facility] IMG 2 [facility] None  03/08/2024 11:10 AM Jayne Vonn DEL, MD [facility]  03/11/2024 10:50 AM [facility] NURSE [facility]  03/15/2024 10:00 AM [facility] IMG 2 [facility] None  03/15/2024 10:50 AM Kizzie Suzen SAUNDERS, CNM [facility]  03/18/2024  9:50 AM [facility] NURSE [facility]  03/22/2024 10:00 AM [facility] IMG 2 [facility] None  03/22/2024 10:50 AM Jayne Vonn DEL, MD [facility]  03/25/2024  9:30 AM [facility] NURSE [facility]    No orders of the defined types were placed in this encounter.  Suzen SAUNDERS Kizzie CNM, [facility] 02/09/2024 10:28 AM

## 2024-02-09 NOTE — BH Specialist Note (Unsigned)
 Integrated Behavioral Health via Telemedicine Visit  02/09/2024 Latasha Peters [id number]  Number of Integrated Behavioral Health Clinician visits: 4- Fourth Visit  Session Start time: 1345   Session End time: 1416  Total time in minutes: 31    Referring Provider: *** Patient/Family location: [facility] Provider location: *** All persons participating in visit: *** Types of Service: {CHL AMB TYPE OF SERVICE:[phone]}  I connected with [name] and/or Samule Stolar's {family members:20773} via  Telephone or Video Enabled Telemedicine Application  (Video is Caregility application) and verified that I am speaking with the correct person using two identifiers. Discussed confidentiality: {YES/NO:21197}  I discussed the limitations of telemedicine and the availability of in person appointments.  Discussed there is a possibility of technology failure and discussed alternative modes of communication if that failure occurs.  I discussed that engaging in this telemedicine visit, they consent to the provision of behavioral healthcare and the services will be billed under their insurance.  Patient and/or legal guardian expressed understanding and consented to Telemedicine visit: {YES/NO:21197}  Presenting Concerns: Patient and/or family reports the following symptoms/concerns: *** Duration of problem: ***; Severity of problem: {Mild/Moderate/Severe:20260}  Patient and/or Family's Strengths/Protective Factors: {CHL AMB BH PROTECTIVE [phone]}  Goals Addressed: Patient will:  Reduce symptoms of: {IBH Symptoms:21014056}   Increase knowledge and/or ability of: {IBH Patient Tools:21014057}   Demonstrate ability to: {IBH Goals:21014053}  Progress towards Goals: {CHL AMB BH PROGRESS TOWARDS GOALS:[phone]}    Interventions: Interventions utilized:  {IBH Interventions:21014054} Standardized Assessments completed: {IBH Screening Tools:21014051}    Patient and/or  Family Response: ***  Clinical Assessment/Diagnosis  No diagnosis found.    Assessment: Patient currently experiencing ***.   Patient may benefit from ***.  Plan: Follow up with behavioral health clinician on : *** Behavioral recommendations: *** Referral(s): {IBH Referrals:21014055}  I discussed the assessment and treatment plan with the patient and/or parent/guardian. They were provided an opportunity to ask questions and all were answered. They agreed with the plan and demonstrated an understanding of the instructions.   They were advised to call back or seek an in-person evaluation if the symptoms worsen or if the condition fails to improve as anticipated.  Hemi Chacko C Germaine Shenker, LCSW

## 2024-02-10 ENCOUNTER — Other Ambulatory Visit: Payer: Self-pay | Admitting: Women's Health

## 2024-02-10 DIAGNOSIS — Z3480 Encounter for supervision of other normal pregnancy, unspecified trimester: Secondary | ICD-10-CM

## 2024-02-10 DIAGNOSIS — O365990 Maternal care for other known or suspected poor fetal growth, unspecified trimester, not applicable or unspecified: Secondary | ICD-10-CM

## 2024-02-10 DIAGNOSIS — Z3A34 34 weeks gestation of pregnancy: Secondary | ICD-10-CM

## 2024-02-10 DIAGNOSIS — O0993 Supervision of high risk pregnancy, unspecified, third trimester: Secondary | ICD-10-CM

## 2024-02-12 ENCOUNTER — Ambulatory Visit

## 2024-02-12 VITALS — BP 115/76 | HR 98 | Wt 179.0 lb

## 2024-02-12 DIAGNOSIS — O0990 Supervision of high risk pregnancy, unspecified, unspecified trimester: Secondary | ICD-10-CM

## 2024-02-12 DIAGNOSIS — O288 Other abnormal findings on antenatal screening of mother: Secondary | ICD-10-CM

## 2024-02-12 DIAGNOSIS — F419 Anxiety disorder, unspecified: Secondary | ICD-10-CM | POA: Diagnosis not present

## 2024-02-12 DIAGNOSIS — O365990 Maternal care for other known or suspected poor fetal growth, unspecified trimester, not applicable or unspecified: Secondary | ICD-10-CM

## 2024-02-12 DIAGNOSIS — Z3A33 33 weeks gestation of pregnancy: Secondary | ICD-10-CM

## 2024-02-12 DIAGNOSIS — F909 Attention-deficit hyperactivity disorder, unspecified type: Secondary | ICD-10-CM

## 2024-02-12 NOTE — Progress Notes (Signed)
° °  NURSE VISIT- NST  SUBJECTIVE:  Latasha Peters is a 23 y.o. [phone] female at [redacted]w[redacted]d, here for a NST for pregnancy complicated by FGR.  She reports active fetal movement, contractions: none, vaginal bleeding: none, membranes: intact.   OBJECTIVE:  BP 115/76   Pulse 98   Wt 179 lb (81.2 kg)   LMP 05/25/2023 (Approximate)   BMI 31.71 kg/m   Appears well, no apparent distress  No results found for this or any previous visit (from the past 24 hours).  NST: FHR baseline 145 bpm, Variability: moderate, Accelerations:present, Decelerations:  Absent= Cat 1/reactive Toco: none   ASSESSMENT: [id number] at [redacted]w[redacted]d with FGR NST reactive  PLAN: EFM strip reviewed by Dr. Ozan   Recommendations: keep next appointment as scheduled    Vitalia Stough E Deshaun Schou  02/12/2024 9:43 AM

## 2024-02-12 NOTE — Patient Instructions (Signed)
 Center for [facility] [address] [place], [place] [address] [phone] (main office) [phone] (Ignacio Lowder's office)     BRAINSTORMING  Develop a Plan Goals: Provide a way to start conversation about your new life with a baby Assist parents in recognizing and using resources within their reach Help pave the way before birth for an easier period of transition afterwards.  Make a list of the following information to keep in a central location: Full name of Mom and Partner: _____________________________________________ Baby's full name and Date of Birth: ___________________________________________ Home Address: ___________________________________________________________ ________________________________________________________________________ Home Phone: ____________________________________________________________ Parents' cell numbers: _____________________________________________________ ________________________________________________________________________ Name and contact info for OB: ______________________________________________ Name and contact info for Pediatrician:________________________________________ Contact info for Lactation Consultants: ________________________________________  REST and SLEEP *You each need at least 4-5 hours of uninterrupted sleep every day. Write specific names and contact information.* How are you going to rest in the postpartum period? While partner's home? When partner returns to work? When you both return to work? Where will your baby sleep? Who is available to help during the day? Evening? Night? Who could move in for a period to help support you? What are some ideas to help you get enough  sleep? __________________________________________________________________________________________________________________________________________________________________________________________________________________________________________ NUTRITIOUS FOOD AND DRINK *Plan for meals before your baby is born so you can have healthy food to eat during the immediate postpartum period.* Who will look after breakfast? Lunch? Dinner? List names and contact information. Brainstorm quick, healthy ideas for each meal. What can you do before baby is born to prepare meals for the postpartum period? How can others help you with meals? Which grocery stores provide online shopping and delivery? Which restaurants offer take-out or delivery options? ______________________________________________________________________________________________________________________________________________________________________________________________________________________________________________________________________________________________________________________________________________________________________________________________________  CARE FOR MOM *It's important that mom is cared for and pampered in the postpartum period. Remember, the most important ways new mothers need care are: sleep, nutrition, gentle exercise, and time off.* Who can come take care of mom during this period? Make a list of people with their contact information. List some activities that make you feel cared for, rested, and energized? Who can make sure you have opportunities to do these things? Does mom have a space of her very own within your home that's just for her? Make a "[facility]" where she can be comfortable, rest, and renew herself  daily. ______________________________________________________________________________________________________________________________________________________________________________________________________________________________________________________________________________________________________________________________________________________________________________________________________    CARE FOR AND FEEDING BABY *Knowledgeable and encouraging people will offer the best support with regard to feeding your baby.* Educate yourself and choose the best feeding option for your baby. Make a list of people who will guide, support, and be a resource for you as your care for and feed your baby. (Friends that have breastfed or are currently breastfeeding, lactation consultants, breastfeeding support groups, etc.) Consider a postpartum doula. (These websites can give you information: dona.org & https://shea.org/) Seek out local breastfeeding resources like the breastfeeding support group at [employer] or [employer]. ______________________________________________________________________________________________________________________________________________________________________________________________________________________________________________________________________________________________________________________________________________________________________________________________________  Judson Roch AND ERRANDS Who can help with a thorough cleaning before baby is born? Make a list of people who will help with housekeeping and chores, like laundry, light cleaning, dishes, bathrooms, etc. Who can run some errands for you? What can you do to make sure you are stocked with basic supplies before baby is born? Who is going to do the  shopping? ______________________________________________________________________________________________________________________________________________________________________________________________________________________________________________________________________________________________________________________________________________________________________________________________________     Family Adjustment *Nurture yourselves.it helps parents be more loving and allows for better bonding with their child.* What sorts of things do you and partner enjoy  doing together? Which activities help you to connect and strengthen your relationship? Make a list of those things. Make a list of people whom you trust to care for your baby so you can have some time together as a couple. What types of things help partner feel connected to Mom? Make a list. What needs will partner have in order to bond with baby? Other children? Who will care for them when you go into labor and while you are in the hospital? Think about what the needs of your older children might be. Who can help you meet those needs? In what ways are you helping them prepare for bringing baby home? List some specific strategies you have for family adjustment. _______________________________________________________________________________________________________________________________________________________________________________________________________________________________________________________________________________________________________________________________________________  SUPPORT *Someone who can empathize with experiences normalizes your problems and makes them more bearable.* Make a list of other friends, neighbors, and/or co-workers you know with infants (and small children, if applicable) with whom you can connect. Make a list of local or online support groups, mom groups, etc. in which you can be  involved. ______________________________________________________________________________________________________________________________________________________________________________________________________________________________________________________________________________________________________________________________________________________________________________________________________  Childcare Plans Investigate and plan for childcare if mom is returning to work. Talk about mom's concerns about her transition back to work. Talk about partner's concerns regarding this transition.  Mental Health *Your mental health is one of the highest priorities for a pregnant or postpartum mom.* 1 in 5 women experience anxiety and/or depression from the time of conception through the first year after birth. Postpartum Mood Disorders are the #1 complication of pregnancy and childbirth and the suffering experienced by these mothers is not necessary! These illnesses are temporary and respond well to treatment, which often includes self-care, social support, talk therapy, and medication when needed. Women experiencing anxiety and depression often say things like: "I'm supposed to be happy.why do I feel so sad?", "Why can't I snap out of it?", "I'm having thoughts that scare me." There is no need to be embarrassed if you are feeling these symptoms: Overwhelmed, anxious, angry, sad, guilty, irritable, hopeless, exhausted but can't sleep You are NOT alone. You are NOT to blame. With help, you WILL be well. Where can I find help? Medical professionals such as your OB, midwife, gynecologist, family practitioner, primary care provider, pediatrician, or mental health providers; [facility] support groups: Feelings After Birth, Breastfeeding Support Group, Baby and Me Group, and Fit 4 Two exercise classes. You have permission to ask for help. It will confirm your feelings, validate your experiences,  share/learn coping strategies, and gain support and encouragement as you heal. You are important! BRAINSTORM Make a list of local resources, including resources for mom and for partner. Identify support groups. Identify people to call late at night - include names and contact info. Talk with partner about perinatal mood and anxiety disorders. Talk with your OB, midwife, and doula about baby blues and about perinatal mood and anxiety disorders. Talk with your pediatrician about perinatal mood and anxiety disorders.   Support & Sanity Savers   What do you really need?  Basics In preparing for a new baby, many expectant parents spend hours shopping for baby clothes, decorating the nursery, and deciding which car seat to buy. Yet most don't think much about what the reality of parenting a newborn will be like, and what they need to make it through that. So, here is the advice of experienced parents. We know you'll read this, and think "they're exaggerating, I don't really need that." Just trust [place] on these, OK? Plan for all of  this, and if it turns out you don't need it, come back and teach [place] how you did it!  Must-Haves (Once baby's survival needs are met, make sure you attend to your own survival needs!) Sleep An average newborn sleeps 16-18 hours per day, over 6-7 sleep periods, rarely more than three hours at a time. It is normal and healthy for a newborn to wake throughout the night... but really hard on parents!! Naps. Prioritize sleep above any responsibilities like: cleaning house, visiting friends, running errands, etc.  Sleep whenever baby sleeps. If you can't nap, at least have restful times when baby eats. The more rest you get, the more patient you will be, the more emotionally stable, and better at solving problems.  Food You may not have realized it would be difficult to eat when you have a newborn. Yet, when we talk to countless new parents, they say things like "it may be 2:00 pm  when I realize I haven't had breakfast yet." Or "every time we sit down to dinner, baby needs to eat, and my food gets cold, so I don't bother to eat it." Finger food. Before your baby is born, stock up with one months' worth of food that: 1) you can eat with one hand while holding a baby, 2) doesn't need to be prepped, 3) is good hot or cold, 4) doesn't spoil when left out for a few hours, and 5) you like to eat. Think about: nuts, dried fruit, Clif bars, pretzels, jerky, gogurt, baby carrots, apples, bananas, crackers, cheez-n-crackers, string cheese, hot pockets or frozen burritos to microwave, garden burgers and breakfast pastries to put in the toaster, yogurt drinks, etc. Restaurant Menus. Make lists of your favorite restaurants & menu items. When family/friends want to help, you can give specific information without much thought. They can either bring you the food or send gift cards for just the right meals. Freezer Meals.  Take some time to make a few meals to put in the freezer ahead of time.  Easy to freeze meals can be anything such as soup, lasagna, chicken pie, or spaghetti sauce. Set up a Meal Schedule.  Ask friends and family to sign up to bring you meals during the first few weeks of being home. (It can be passed around at baby showers!) You have no idea how helpful this will be until you are in the throes of parenting.  Machine[employer].it is a great website to check out. Emotional Support Know who to call when you're stressed out. Parenting a newborn is very challenging work. There are times when it totally overwhelms your normal coping abilities. EVERY NEW PARENT NEEDS TO HAVE A PLAN FOR WHO TO CALL WHEN THEY JUST CAN'T COPE ANY MORE. (And it has to be someone other than the baby's other parent!) Before your baby is born, come up with at least one person you can call for support - write their phone number down and post it on the refrigerator. Anxiety & Sadness. Baby blues are normal after  pregnancy; however, there are more severe types of anxiety & sadness which can occur and should not be ignored.  They are always treatable, but you have to take the first step by reaching out for help. [facility] offers a "Mom Talk" group which meets every Tuesday from 10 am - 11 am.  This group is for new moms who need support and connection after their babies are born.  Call [phone].  Really, Really Helpful (Plan for them!  Make sure these happen often!!) Physical Support with Taking Care of Yourselves Asking friends and family. Before your baby is born, set up a schedule of people who can come and visit and help out (or ask a friend to schedule for you). Any time someone says "let me know what I can do to help," sign them up for a day. When they get there, their job is not to take care of the baby (that's your job and your joy). Their job is to take care of you!  Postpartum doulas. If you don't have anyone you can call on for support, look into postpartum doulas:  professionals at helping parents with caring for baby, caring for themselves, getting breastfeeding started, and helping with household tasks. www.padanc.org is a helpful website for learning about doulas in our area. Peer Support / Parent Groups Why: One of the greatest ideas for new parents is to be around other new parents. Parent groups give you a chance to share and listen to others who are going through the same season of life, get a sense of what is normal infant development by watching several babies learn and grow, share your stories of triumph and struggles with empathetic ears, and forgive your own mistakes when you realize all parents are learning by trial and error. Where to find: There are many places you can meet other new parents throughout our community.  [facility] offers the following classes for new moms and their little ones:  Baby and Me (Birth to Crawling) and Breastfeeding Support Group. Go to  www.conehealthybaby.com or call [phone] for more information. Time for your Relationship It's easy to get so caught up in meeting baby's immediate needs that it's hard to find time to connect with your partner, and meet the needs of your relationship. It's also easy to forget what "quality time with your partner" actually looks like. If you take your baby on a date, you'd be amazed how much of your couple time is spent feeding the baby, diapering the baby, admiring the baby, and talking about the baby. Dating: Try to take time for just the two of you. Babysitter tip: Sometimes when moms are breastfeeding a newborn, they find it hard to figure out how to schedule outings around baby's unpredictable feeding schedules. Have the babysitter come for a three hour period. When she comes over, if baby has just eaten, you can leave right away, and come back in two hours. If baby hasn't fed recently, you start the date at home. Once baby gets hungry and gets a good feeding in, you can head out for the rest of your date time. Date Nights at Home: If you can't get out, at least set aside one evening a week to prioritize your relationship: whenever baby dozes off or doesn't have any immediate needs, spend a little time focusing on each other. Potential conflicts: The main relationship conflicts that come up for new parents are: issues related to sexuality, financial stresses, a feeling of an unfair division of household tasks, and conflicts in parenting styles. The more you can work on these issues before baby arrives, the better!  Fun and Frills (Don't forget these. and don't feel guilty for indulging in them!) Everyone has something in life that is a fun little treat that they do just for themselves. It may be: reading the morning paper, or going for a daily jog, or having coffee with a friend once a week, or going to a movie on Friday nights,  or fine chocolates, or bubble baths, or curling up with a good  book. Unless you do fun things for yourself every now and then, it's hard to have the energy for fun with your baby. Whatever your "special" treats are, make sure you find a way to continue to indulge in them after your baby is born. These special moments can recharge you, and allow you to return to baby with a new joy   PERINATAL MOOD DISORDERS: MATERNAL MENTAL HEALTH FROM CONCEPTION THROUGH THE POSTPARTUM PERIOD   _________________________________________Emergency and Crisis Resources If you are an imminent risk to self or others, are experiencing intense personal distress, and/or have noticed significant changes in activities of daily living, call:  [facility]: [phone]  [address] [place], [place], [address] Mobile Crisis: [phone] National Suicide Hotline: 22 Or visit the following crisis centers: Local Emergency Departments [facility]:  [address], [place]  Mon-Friday 8am-3pm, [phone]                                                                                  ___________ Non-Crisis Resources To identify specific providers that are covered by your insurance, contact your insurance company or local agencies:   Postpartum Support International- Warm-line: 629-532-6019                                                      __Outpatient Therapy and Medication Management   Providers:  [facility]: [phone] Hours: 9AM-5PM  Insurance Accepted: Pilar Jarvis, BCBS, Crista Luria, [place], Medicare  [facility]): [phone] Hours: 8AM-5:30PM  nsurance Accepted: All insurances EXCEPT AARP, [place], [place], and [employer] of the [place]: [phone] Hours: 8AM-8PM Insurance Accepted: Ulla Gallo, Crista Luria, [place], Medicare, [phone] [facility]: [phone] Hours: 8:30AM-7PM Insurance Accepted: Ulla Gallo, Medicaid, Medicare, Tricare, [employer] [facility]:  [phone]   Hours:9AM-5PM Insurance Accepted:  Monia Pouch, Ezequiel Essex, [employer], [place], [employer] [facility]  [facility]: [phone] Hours: 9AM-5:30PM Insurance Accepted: Pilar Jarvis, Teodoro Spray, and Medicaid, Medicare, [facility]:  [phone] Hours: 9am-5pm Insurance Accepted: BCBS; they do not accept Medicaid/Medicare [facility]: [phone] Hours: 9am-9pm Insurance Accepted: All major insurance including Medicaid and Medicare [facility]: [phone] Hours: 9AM- 5PM Insurance Accepted: All insurances EXCEPT Medicaid and Medicare. [facility]: [phone]   ____________                                                                     Parenting Support Groups [facility] at [facility] :  [address], [place], [place], [address] [phone] [facility]:  [phone] Family Support Network: (support for children in the NICU and/or with special needs), [phone]     _____________                                                                                  Online Resources Postpartum Support International: SeekAlumni.co.za  800-944-4PPD Supporting Moms:  www.momssupportingmoms.net

## 2024-02-13 ENCOUNTER — Encounter: Payer: Self-pay | Admitting: Women's Health

## 2024-02-16 ENCOUNTER — Ambulatory Visit: Admitting: Radiology

## 2024-02-16 ENCOUNTER — Encounter: Payer: Self-pay | Admitting: Women's Health

## 2024-02-16 ENCOUNTER — Ambulatory Visit: Admitting: Women's Health

## 2024-02-16 VITALS — BP 104/72 | HR 84 | Wt 184.0 lb

## 2024-02-16 DIAGNOSIS — O365990 Maternal care for other known or suspected poor fetal growth, unspecified trimester, not applicable or unspecified: Secondary | ICD-10-CM

## 2024-02-16 DIAGNOSIS — K5900 Constipation, unspecified: Secondary | ICD-10-CM | POA: Diagnosis not present

## 2024-02-16 DIAGNOSIS — Z3A34 34 weeks gestation of pregnancy: Secondary | ICD-10-CM

## 2024-02-16 DIAGNOSIS — O365930 Maternal care for other known or suspected poor fetal growth, third trimester, not applicable or unspecified: Secondary | ICD-10-CM

## 2024-02-16 DIAGNOSIS — Z3A33 33 weeks gestation of pregnancy: Secondary | ICD-10-CM | POA: Diagnosis not present

## 2024-02-16 DIAGNOSIS — O0993 Supervision of high risk pregnancy, unspecified, third trimester: Secondary | ICD-10-CM

## 2024-02-16 DIAGNOSIS — O99891 Other specified diseases and conditions complicating pregnancy: Secondary | ICD-10-CM | POA: Diagnosis not present

## 2024-02-16 NOTE — Progress Notes (Signed)
 HIGH-RISK PREGNANCY VISIT Patient name: Latasha Peters MRN [id number]  Date of birth: [birth year] Chief Complaint:   Routine Prenatal Visit  History of Present Illness:   Latasha Peters is a 23 y.o. [id number] female at [redacted]w[redacted]d with an Estimated Date of Delivery: 03/26/24 being seen today for ongoing management of a high-risk pregnancy complicated by resolved/borderline FGR now 11.8% (AC 22%).    Today she reports constipation. Contractions: Not present. Vag. Bleeding: None.  Movement: Present. denies leaking of fluid.      01/15/2024    2:06 PM 12/30/2023   11:47 AM 09/15/2023    2:35 PM  Depression screen PHQ 2/9  Decreased Interest 0 0 0  Down, Depressed, Hopeless 0 1 0  PHQ - 2 Score 0 1 0  Altered sleeping 3 3 0  Tired, decreased energy 3 3 3   Change in appetite 0 3 1  Feeling bad or failure about yourself  0 1 0  Trouble concentrating 1 3 1   Moving slowly or fidgety/restless 0 3 0  Suicidal thoughts 0 0 0  PHQ-9 Score 7 17  5       Data saved with a previous flowsheet row definition        01/15/2024    2:07 PM 12/30/2023   11:50 AM 09/15/2023    2:35 PM  GAD 7 : Generalized Anxiety Score  Nervous, Anxious, on Edge 1 3 0  Control/stop worrying 1 1 0  Worry too much - different things 0 3 0  Trouble relaxing 1 1 0  Restless 0 3 0  Easily annoyed or irritable 1 0 0  Afraid - awful might happen 1 3 2   Total GAD 7 Score 5 14 2      Review of Systems:   Pertinent items are noted in HPI Denies abnormal vaginal discharge w/ itching/odor/irritation, headaches, visual changes, shortness of breath, chest pain, abdominal pain, severe nausea/vomiting, or problems with urination or bowel movements unless otherwise stated above. Pertinent History Reviewed:  Reviewed past medical,surgical, social, obstetrical and family history.  Reviewed problem list, medications and allergies. Physical Assessment:   Vitals:   02/16/24 1110  BP: 104/72  Pulse: 84  Weight: 184 lb (83.5 kg)   Body mass index is 32.59 kg/m.           Physical Examination:   General appearance: alert, well appearing, and in no distress  Mental status: alert, oriented to person, place, and time  Skin: warm & dry   Extremities:      Cardiovascular: normal heart rate noted  Respiratory: normal respiratory effort, no distress  Abdomen: gravid, soft, non-tender  Pelvic: Cervical exam deferred         Fetal Status:     Movement: Present    Fetal Surveillance Testing today: US : GA = 34+3 weeks  Single active female fetus, cephalic, FHR=152 bpm, anterior pl, gr2, AFI = 14.2 cm, MVP = 4.7 cm, BPP = 8/8, RI: 0.57, 0.63  avg: 0.60 58%, EFW 2083g, 11.8%, AC 22%, cervix closed    Chaperone: N/A  No results found for this or any previous visit (from the past 24 hours).  Assessment & Plan:  High-risk pregnancy: [id number] at [redacted]w[redacted]d with an Estimated Date of Delivery: 03/26/24   1) Resolved/borderline FGR, now 11.8% (from 4%), AC 22% (from 6%), bpp 8/8, UAD 58%. Discussed improvement, can discuss whether to continue antenatal testing next week w/ Dr. Jayne  2) Constipation, info given  Meds: No orders of the defined types were  placed in this encounter.   Labs/procedures today: U/S  Treatment Plan:  as scheduled  Reviewed: Preterm labor symptoms and general obstetric precautions including but not limited to vaginal bleeding, contractions, leaking of fluid and fetal movement were reviewed in detail with the patient.  All questions were answered. Does have home bp cuff. Office bp cuff given: not applicable. Check bp weekly, let us  know if consistently >140 and/or >90.  Follow-up: Return for As scheduled.   Future Appointments  Date Time Provider Department Center  02/19/2024  9:50 AM [facility] NURSE [facility]  02/23/2024  1:30 PM [facility] 2 [facility] None  02/23/2024  2:30 PM Jayne Vonn DEL, MD [facility]  03/01/2024 10:00 AM [facility] 2 [facility] None  03/01/2024 11:10 AM Kizzie Suzen SAUNDERS, CNM [facility]  03/08/2024 10:00 AM [facility] 2 [facility] None  03/08/2024 11:10 AM Jayne Vonn DEL, MD [facility]  03/11/2024 10:50 AM [facility] NURSE [facility]  03/15/2024 10:00 AM [facility] 2 [facility] None  03/15/2024 10:50 AM Kizzie Suzen SAUNDERS, CNM [facility]  03/18/2024  9:50 AM [facility] NURSE [facility]  03/22/2024 10:00 AM [facility] 2 [facility] None  03/22/2024 10:50 AM Jayne Vonn DEL, MD [facility]  03/25/2024  9:30 AM [facility] NURSE [facility]  03/30/2024  1:45 PM [facility]    No orders of the defined types were placed in this encounter.  Suzen SAUNDERS Kizzie CNM, [facility] 02/16/2024 11:30 AM

## 2024-02-16 NOTE — Patient Instructions (Signed)
 Latasha Peters, thank you for choosing our office today! We appreciate the opportunity to meet your healthcare needs. You may receive a short survey by mail, e-mail, or through [employer]. If you are happy with your care we would appreciate if you could take just a few minutes to complete the survey questions. We read all of your comments and take your feedback very seriously. Thank you again for choosing our office.  Center for [employer] Team at [facility]  [facility] at [facility] ([address] [place], [place] [address]) Entrance C, located off of [address] 24/7 valet parking   Constipation Drink plenty of fluid, preferably water, throughout the day Eat foods high in fiber such as fruits, vegetables, and grains Exercise, such as walking, is a good way to keep your bowels regular Drink warm fluids, especially warm prune juice, or decaf coffee Eat a 1/2 cup of real oatmeal (not instant), 1/2 cup applesauce, and 1/2-1 cup warm prune juice every day If needed, you may take Colace (docusate sodium) stool softener once or twice a day to help keep the stool soft. If you are pregnant, wait until you are out of your first trimester (12-14 weeks of pregnancy) If you still are having problems with constipation, you may take Miralax once daily as needed to help keep your bowels regular.  If you are pregnant, wait until you are out of your first trimester (12-14 weeks of pregnancy)    CLASSES: Go to [email or web address] to register for classes (childbirth, breastfeeding, waterbirth, infant CPR, daddy bootcamp, etc.)  Call the office [phone]) or go to [facility] if: You begin to have strong, frequent contractions Your water breaks.  Sometimes it is a big gush of fluid, sometimes it is just a trickle that keeps getting your panties wet or running down your legs You have vaginal bleeding.  It is normal to have a small amount of spotting if your cervix was checked.  You don't  feel your baby moving like normal.  If you don't, get you something to eat and drink and lay down and focus on feeling your baby move.   If your baby is still not moving like normal, you should call the office or go to [facility].  Call the office [phone]) or go to [facility] hospital for these signs of pre-eclampsia: Severe headache that does not go away with Tylenol  Visual changes- seeing spots, double, blurred vision Pain under your right breast or upper abdomen that does not go away with Tums or heartburn medicine Nausea and/or vomiting Severe swelling in your hands, feet, and face   Tdap Vaccine It is recommended that you get the Tdap vaccine during the third trimester of EACH pregnancy to help protect your baby from getting pertussis (whooping cough) 27-36 weeks is the BEST time to do this so that you can pass the protection on to your baby. During pregnancy is better than after pregnancy, but if you are unable to get it during pregnancy it will be offered at the hospital.  You can get this vaccine with us , at the health department, your family doctor, or some local pharmacies Everyone who will be around your baby should also be up-to-date on their vaccines before the baby comes. Adults (who are not pregnant) only need 1 dose of Tdap during adulthood.   [facility] Pediatricians/Family Doctors [facility]): [address] [place], [phone]           [facility]:  [address], [phone]                [facility]): [address], [phone] (call to ask if accepting patients) [facility] Department: [address], [place], [phone]    [facility] Pediatricians/Family Doctors [facility]): [phone] [address], [phone] [facility]: [address] [place], [phone] [facility] [place]: [address], [phone]  [facility]  Doctors  [facility]): [phone] [facility]: [address] [phone]   [facility] Doctors [facility]: [address], [phone]  [facility] Doctors  [facility]: [phone], [phone]  Home Blood Pressure Monitoring for Patients   Your provider has recommended that you check your blood pressure (BP) at least once a week at home. If you do not have a blood pressure cuff at home, one will be provided for you. Contact your provider if you have not received your monitor within 1 week.   Helpful Tips for Accurate Home Blood Pressure Checks  Don't smoke, exercise, or drink caffeine  30 minutes before checking your BP Use the restroom before checking your BP (a full bladder can raise your pressure) Relax in a comfortable upright chair Feet on the ground Left arm resting comfortably on a flat surface at the level of your heart Legs uncrossed Back supported Sit quietly and don't talk Place the cuff on your bare arm Adjust snuggly, so that only two fingertips can fit between your skin and the top of the cuff Check 2 readings separated by at least one minute Keep a log of your BP readings For a visual, please reference this diagram: http://ccnc.care/bpdiagram  Provider Name: [facility]     Phone: [phone]  Zone 1: ALL CLEAR  Continue to monitor your symptoms:  BP reading is less than 140 (top number) or less than 90 (bottom number)  No right upper stomach pain No headaches or seeing spots No feeling nauseated or throwing up No swelling in face and hands  Zone 2: CAUTION Call your doctor's office for any of the following:  BP reading is greater than 140 (top number) or greater than 90 (bottom number)  Stomach pain under your ribs in the middle or right side Headaches or seeing spots Feeling nauseated or throwing up Swelling in face and hands  Zone 3: EMERGENCY   Seek immediate medical care if you have any of the following:  BP reading is greater than160 (top number) or greater than 110 (bottom number) Severe headaches not improving with Tylenol  Serious difficulty catching your breath Any worsening symptoms from Zone 2  Preterm Labor and Birth Information  The normal length of a pregnancy is 39-41 weeks. Preterm labor is when labor starts before 37 completed weeks of pregnancy. What are the risk factors for preterm labor? Preterm labor is more likely to occur in women who: Have certain infections during pregnancy such as a bladder infection, sexually transmitted infection, or infection inside the uterus (chorioamnionitis). Have a shorter-than-normal cervix. Have gone into preterm labor before. Have had surgery on their cervix. Are younger than age 1 or older than age 51. Are African American. Are pregnant with twins or multiple babies (multiple gestation). Take street drugs or smoke while pregnant. Do not gain enough weight while pregnant. Became pregnant shortly after having been pregnant. What are the symptoms of preterm labor? Symptoms  of preterm labor include: Cramps similar to those that can happen during a menstrual period. The cramps may happen with diarrhea. Pain in the abdomen or lower back. Regular uterine contractions that may feel like tightening of the abdomen. A feeling of increased pressure in the pelvis. Increased watery or bloody mucus discharge from the vagina. Water breaking (ruptured amniotic sac). Why is it important to recognize signs of preterm labor? It is important to recognize signs of preterm labor because babies who are born prematurely may not be fully developed. This can put them at an increased risk for: Long-term (chronic) heart and lung problems. Difficulty immediately after birth with regulating body systems, including blood sugar, body temperature, heart rate, and breathing rate. Bleeding in the  brain. Cerebral palsy. Learning difficulties. Death. These risks are highest for babies who are born before 34 weeks of pregnancy. How is preterm labor treated? Treatment depends on the length of your pregnancy, your condition, and the health of your baby. It may involve: Having a stitch (suture) placed in your cervix to prevent your cervix from opening too early (cerclage). Taking or being given medicines, such as: Hormone medicines. These may be given early in pregnancy to help support the pregnancy. Medicine to stop contractions. Medicines to help mature the babys lungs. These may be prescribed if the risk of delivery is high. Medicines to prevent your baby from developing cerebral palsy. If the labor happens before 34 weeks of pregnancy, you may need to stay in the hospital. What should I do if I think I am in preterm labor? If you think that you are going into preterm labor, call your health care provider right away. How can I prevent preterm labor in future pregnancies? To increase your chance of having a full-term pregnancy: Do not use any tobacco products, such as cigarettes, chewing tobacco, and e-cigarettes. If you need help quitting, ask your health care provider. Do not use street drugs or medicines that have not been prescribed to you during your pregnancy. Talk with your health care provider before taking any herbal supplements, even if you have been taking them regularly. Make sure you gain a healthy amount of weight during your pregnancy. Watch for infection. If you think that you might have an infection, get it checked right away. Make sure to tell your health care provider if you have gone into preterm labor before. This information is not intended to replace advice given to you by your health care provider. Make sure you discuss any questions you have with your health care provider. Document Revised: 06/12/2018 Document Reviewed: 07/12/2015 Elsevier Patient Education   2020 [employer].

## 2024-02-16 NOTE — Progress Notes (Signed)
 US : GA = 34+3 weeks  Single active female fetus, cephalic, FHR=152 bpm, anterior pl, gr2, AFI = 14.2 cm, MVP = 4.7 cm, BPP = 8/8, RI: 0.57, 0.63  avg: 0.60 58%, EFW 2083g, 11.8%, AC 22%, cervix closed

## 2024-02-17 ENCOUNTER — Encounter: Payer: Self-pay | Admitting: Obstetrics & Gynecology

## 2024-02-17 ENCOUNTER — Other Ambulatory Visit: Payer: Self-pay | Admitting: Women's Health

## 2024-02-17 DIAGNOSIS — Z3480 Encounter for supervision of other normal pregnancy, unspecified trimester: Secondary | ICD-10-CM

## 2024-02-17 DIAGNOSIS — O365990 Maternal care for other known or suspected poor fetal growth, unspecified trimester, not applicable or unspecified: Secondary | ICD-10-CM

## 2024-02-17 DIAGNOSIS — O0993 Supervision of high risk pregnancy, unspecified, third trimester: Secondary | ICD-10-CM

## 2024-02-17 DIAGNOSIS — Z3A35 35 weeks gestation of pregnancy: Secondary | ICD-10-CM

## 2024-02-19 ENCOUNTER — Encounter: Payer: Self-pay | Admitting: *Deleted

## 2024-02-19 ENCOUNTER — Ambulatory Visit

## 2024-02-19 VITALS — BP 106/69 | HR 93

## 2024-02-19 DIAGNOSIS — Z3A34 34 weeks gestation of pregnancy: Secondary | ICD-10-CM

## 2024-02-19 DIAGNOSIS — O365990 Maternal care for other known or suspected poor fetal growth, unspecified trimester, not applicable or unspecified: Secondary | ICD-10-CM

## 2024-02-19 DIAGNOSIS — O365930 Maternal care for other known or suspected poor fetal growth, third trimester, not applicable or unspecified: Secondary | ICD-10-CM

## 2024-02-19 NOTE — Progress Notes (Addendum)
° °  NURSE VISIT- NST  SUBJECTIVE:  Latasha Peters is a 23 y.o. [phone] female at [redacted]w[redacted]d, here for a NST for pregnancy complicated by FGR.  She reports active fetal movement, contractions: none, vaginal bleeding: none, membranes: intact.   OBJECTIVE:  BP 106/69   Pulse 93   LMP 05/25/2023 (Approximate)   Appears well, no apparent distress  No results found for this or any previous visit (from the past 24 hours).  NST: FHR baseline 145 bpm, Variability: moderate, Accelerations:present, Decelerations:  Absent= Cat 1/reactive Toco: none   ASSESSMENT: H5E7987 at [redacted]w[redacted]d with FGR NST reactive  PLAN: EFM strip reviewed by Sherrell Ely, CNM   Recommendations: keep next appointment as scheduled    Alan LITTIE Fischer  02/19/2024 10:48 AM   Chart reviewed for nurse visit. Agree with plan of care.  Ely Mering, CNM 02/19/2024 11:58 AM

## 2024-02-23 ENCOUNTER — Encounter: Admitting: Obstetrics & Gynecology

## 2024-02-23 ENCOUNTER — Other Ambulatory Visit: Admitting: Radiology

## 2024-02-23 ENCOUNTER — Encounter: Payer: Self-pay | Admitting: Obstetrics & Gynecology

## 2024-02-23 VITALS — BP 107/74 | HR 92 | Wt 184.0 lb

## 2024-02-23 DIAGNOSIS — O365990 Maternal care for other known or suspected poor fetal growth, unspecified trimester, not applicable or unspecified: Secondary | ICD-10-CM

## 2024-02-23 DIAGNOSIS — G935 Compression of brain: Secondary | ICD-10-CM

## 2024-02-23 DIAGNOSIS — O365930 Maternal care for other known or suspected poor fetal growth, third trimester, not applicable or unspecified: Secondary | ICD-10-CM

## 2024-02-23 DIAGNOSIS — Z3A35 35 weeks gestation of pregnancy: Secondary | ICD-10-CM

## 2024-02-23 DIAGNOSIS — O0993 Supervision of high risk pregnancy, unspecified, third trimester: Secondary | ICD-10-CM

## 2024-02-23 DIAGNOSIS — O99353 Diseases of the nervous system complicating pregnancy, third trimester: Secondary | ICD-10-CM | POA: Diagnosis not present

## 2024-02-23 DIAGNOSIS — Z6791 Unspecified blood type, Rh negative: Secondary | ICD-10-CM | POA: Diagnosis not present

## 2024-02-23 NOTE — Progress Notes (Signed)
 US : GA = 35+3 weeks Single active female fetus, cephalic, FHR = 149 bpm, AFI = 15.3 cm, MVP = 4.6 cm, BPP = 8/8, RI: 0.61, 0.65, 0.55  avg: 0.61  66%, cervix appears closed, nl ov's

## 2024-02-23 NOTE — Progress Notes (Signed)
 "   HIGH-RISK PREGNANCY VISIT Patient name: Latasha Peters MRN [id number]  Date of birth: 2000/07/30 Chief Complaint:   Routine Prenatal Visit  History of Present Illness:   Latasha Peters is a 23 y.o. H5E7987 female at [redacted]w[redacted]d with an Estimated Date of Delivery: 03/26/24 being seen today for ongoing management of a high-risk pregnancy complicated by     ICD-10-CM   1. Encounter for supervision of high risk pregnancy in third trimester, antepartum  O09.93     2. Fetal growth restriction antepartum, resolved 11.8% EFW w/ AC 22%  O36.5990     3. 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    Today she reports no complaints. Contractions: Not present. Vag. Bleeding: None.  Movement: Present. denies leaking of fluid.      01/15/2024    2:06 PM 12/30/2023   11:47 AM 09/15/2023    2:35 PM  Depression screen PHQ 2/9  Decreased Interest 0 0 0  Down, Depressed, Hopeless 0 1 0  PHQ - 2 Score 0 1 0  Altered sleeping 3 3 0  Tired, decreased energy 3 3 3   Change in appetite 0 3 1  Feeling bad or failure about yourself  0 1 0  Trouble concentrating 1 3 1   Moving slowly or fidgety/restless 0 3 0  Suicidal thoughts 0 0 0  PHQ-9 Score 7 17  5       Data saved with a previous flowsheet row definition        01/15/2024    2:07 PM 12/30/2023   11:50 AM 09/15/2023    2:35 PM  GAD 7 : Generalized Anxiety Score  Nervous, Anxious, on Edge 1 3 0  Control/stop worrying 1 1 0  Worry too much - different things 0 3 0  Trouble relaxing 1 1 0  Restless 0 3 0  Easily annoyed or irritable 1 0 0  Afraid - awful might happen 1 3 2   Total GAD 7 Score 5 14 2      Review of Systems:   Pertinent items are noted in HPI Denies abnormal vaginal discharge w/ itching/odor/irritation, headaches, visual changes, shortness of breath, chest pain, abdominal pain, severe nausea/vomiting, or problems with urination or bowel movements unless otherwise stated above. Pertinent History Reviewed:  Reviewed  past medical,surgical, social, obstetrical and family history.  Reviewed problem list, medications and allergies. Physical Assessment:   Vitals:   02/23/24 1406  BP: 107/74  Pulse: 92  Weight: 184 lb (83.5 kg)  Body mass index is 32.59 kg/m.           Physical Examination:   General appearance: alert, well appearing, and in no distress  Mental status: alert, oriented to person, place, and time  Skin: warm & dry   Extremities:      Cardiovascular: normal heart rate noted  Respiratory: normal respiratory effort, no distress  Abdomen: gravid, soft, non-tender  Pelvic: Cervical exam deferred         Fetal Status:     Movement: Present    Fetal Surveillance Testing today: BPP 8/8   Chaperone:     No results found for this or any previous visit (from the past 24 hours).  Assessment & Plan:  High-risk pregnancy: H5E7987 at [redacted]w[redacted]d with an Estimated Date of Delivery: 03/26/24      ICD-10-CM   1. Encounter for supervision of high risk pregnancy in third trimester, antepartum  O09.93     2.  Fetal growth restriction antepartum, resolved 11.8% EFW w/ AC 22%  O36.5990     3. 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   Orders: No orders of the defined types were placed in this encounter.    Labs/procedures today: U/S  Treatment Plan:  Cultures next week, borderline FGR cont BPP and EFW but IOL will not be an issue if stays >10%    Follow-up: No follow-ups on file.   Future Appointments  Date Time Provider Department Center  02/23/2024  2:30 PM Jayne Vonn DEL, MD [facility]  03/01/2024 10:00 AM [facility] - FT IMG 2 [facility] None  03/01/2024 11:10 AM Kizzie Suzen SAUNDERS, CNM [facility]  03/08/2024 10:00 AM [facility] 2 [facility] None  03/08/2024 11:10 AM Jayne Vonn DEL, MD [facility]  03/11/2024 10:50 AM [facility] NURSE [facility]  03/15/2024 10:00 AM [facility]G 2 [facility]  None  03/15/2024 10:50 AM Kizzie Suzen SAUNDERS, CNM [facility]  03/18/2024  9:50 AM [facility] NURSE [facility]  03/22/2024 10:00 AM [facility]G 2 [facility] None  03/22/2024 10:50 AM Jayne Vonn DEL, MD [facility]  03/25/2024  9:30 AM [facility] NURSE [facility]  03/30/2024  1:45 PM [facility]    No orders of the defined types were placed in this encounter.  Vonn DEL Jayne  Attending Physician for the Center for [facility] 02/23/2024 2:22 PM  "

## 2024-03-01 ENCOUNTER — Other Ambulatory Visit: Payer: Self-pay | Admitting: Women's Health

## 2024-03-01 ENCOUNTER — Ambulatory Visit: Admitting: Radiology

## 2024-03-01 ENCOUNTER — Ambulatory Visit: Admitting: Women's Health

## 2024-03-01 ENCOUNTER — Encounter: Payer: Self-pay | Admitting: Women's Health

## 2024-03-01 ENCOUNTER — Other Ambulatory Visit (HOSPITAL_COMMUNITY)
Admission: RE | Admit: 2024-03-01 | Discharge: 2024-03-01 | Disposition: A | Discharge status: Home / Self Care | Source: Ambulatory Visit | Attending: Women's Health | Admitting: Women's Health

## 2024-03-01 VITALS — BP 109/71 | HR 92 | Wt 185.6 lb

## 2024-03-01 DIAGNOSIS — Z3A37 37 weeks gestation of pregnancy: Secondary | ICD-10-CM

## 2024-03-01 DIAGNOSIS — Z3480 Encounter for supervision of other normal pregnancy, unspecified trimester: Secondary | ICD-10-CM

## 2024-03-01 DIAGNOSIS — O365930 Maternal care for other known or suspected poor fetal growth, third trimester, not applicable or unspecified: Secondary | ICD-10-CM

## 2024-03-01 DIAGNOSIS — O0993 Supervision of high risk pregnancy, unspecified, third trimester: Secondary | ICD-10-CM

## 2024-03-01 DIAGNOSIS — Z3A36 36 weeks gestation of pregnancy: Secondary | ICD-10-CM | POA: Insufficient documentation

## 2024-03-01 DIAGNOSIS — O365990 Maternal care for other known or suspected poor fetal growth, unspecified trimester, not applicable or unspecified: Secondary | ICD-10-CM

## 2024-03-01 DIAGNOSIS — O0990 Supervision of high risk pregnancy, unspecified, unspecified trimester: Secondary | ICD-10-CM

## 2024-03-01 DIAGNOSIS — Z3A35 35 weeks gestation of pregnancy: Secondary | ICD-10-CM

## 2024-03-01 DIAGNOSIS — Z6841 Body Mass Index (BMI) 40.0 and over, adult: Secondary | ICD-10-CM

## 2024-03-01 DIAGNOSIS — O09893 Supervision of other high risk pregnancies, third trimester: Secondary | ICD-10-CM

## 2024-03-01 DIAGNOSIS — Z6791 Unspecified blood type, Rh negative: Secondary | ICD-10-CM

## 2024-03-01 NOTE — Progress Notes (Signed)
 "  HIGH-RISK PREGNANCY VISIT Patient name: Latasha Peters MRN [id number]  Date of birth: [birth year] Chief Complaint:   Routine Prenatal Visit  History of Present Illness:   Bradley Handyside is a 23 y.o. [id number] female at [redacted]w[redacted]d with an Estimated Date of Delivery: 03/26/24 being seen today for ongoing management of a high-risk pregnancy complicated by improved but borderline FGR 11.8%.    Today she reports no complaints. Contractions: Not present. Vag. Bleeding: None.  Movement: Present. denies leaking of fluid.      01/15/2024    2:06 PM 12/30/2023   11:47 AM 09/15/2023    2:35 PM  Depression screen PHQ 2/9  Decreased Interest 0 0 0  Down, Depressed, Hopeless 0 1 0  PHQ - 2 Score 0 1 0  Altered sleeping 3 3 0  Tired, decreased energy 3 3 3   Change in appetite 0 3 1  Feeling bad or failure about yourself  0 1 0  Trouble concentrating 1 3 1   Moving slowly or fidgety/restless 0 3 0  Suicidal thoughts 0 0 0  PHQ-9 Score 7 17  5       Data saved with a previous flowsheet row definition        01/15/2024    2:07 PM 12/30/2023   11:50 AM 09/15/2023    2:35 PM  GAD 7 : Generalized Anxiety Score  Nervous, Anxious, on Edge 1 3 0  Control/stop worrying 1 1 0  Worry too much - different things 0 3 0  Trouble relaxing 1 1 0  Restless 0 3 0  Easily annoyed or irritable 1 0 0  Afraid - awful might happen 1 3 2   Total GAD 7 Score 5 14 2      Review of Systems:   Pertinent items are noted in HPI Denies abnormal vaginal discharge w/ itching/odor/irritation, headaches, visual changes, shortness of breath, chest pain, abdominal pain, severe nausea/vomiting, or problems with urination or bowel movements unless otherwise stated above. Pertinent History Reviewed:  Reviewed past medical,surgical, social, obstetrical and family history.  Reviewed problem list, medications and allergies. Physical Assessment:   Vitals:   03/01/24 1028  BP: 109/71  Pulse: 92  Weight: 185 lb 9.6 oz (84.2 kg)   Body mass index is 32.88 kg/m.           Physical Examination:   General appearance: alert, well appearing, and in no distress  Mental status: alert, oriented to person, place, and time  Skin: warm & dry   Extremities:      Cardiovascular: normal heart rate noted  Respiratory: normal respiratory effort, no distress  Abdomen: gravid, soft, non-tender  Pelvic: Cervical exam performed  Dilation: Fingertip Effacement (%): Thick Station: -3  Fetal Status:     Movement: Present Presentation: Vertex  Fetal Surveillance Testing today: US : GA = 36+3 weeks Single active female fetus, cephalic, FHR = 145 bpm, anterior pl, gr2, AFI = 13 cm, MVP = 5.2 cm, BPP = 8/8, RI: 0.53, 0.53, 0.46,  avg: 0.51 25%, cervix appears closed, nl ov's  Chaperone: Clarita Salt  No results found for this or any previous visit (from the past 24 hours).  Assessment & Plan:  High-risk pregnancy: [id number] at [redacted]w[redacted]d with an Estimated Date of Delivery: 03/26/24   1) Improved but borderline FGR, EFW 11.8% @ 34.3w, UAD & bpp wnl today, per LHE continue testing, no IOL if stays >10%   Meds: No orders of the defined types were placed in this encounter.   Labs/procedures  today: GBS, GC/CT, SVE, and U/S  Treatment Plan:  2x/wk testing  Reviewed: Preterm labor symptoms and general obstetric precautions including but not limited to vaginal bleeding, contractions, leaking of fluid and fetal movement were reviewed in detail with the patient.  All questions were answered. Does have home bp cuff. Office bp cuff given: not applicable. Check bp weekly, let us  know if consistently >140 and/or >90.  Follow-up: Return for As scheduled.   Future Appointments  Date Time Provider Department Center  03/08/2024 10:00 AM [facility] - FT IMG 2 [facility] None  03/08/2024 11:10 AM Jayne Vonn DEL, MD [facility]  03/11/2024 10:50 AM [facility] NURSE [facility]  03/15/2024 10:00 AM [facility] - FT IMG 2 [facility]FTIMG None  03/15/2024 10:50 AM Kizzie Suzen SAUNDERS, CNM [facility]  03/18/2024  9:50 AM [facility]GYN NURSE [facility]  03/22/2024 10:00 AM [facility] - FT IMG 2 [facility]-FTIMG None  03/22/2024 10:50 AM Jayne Vonn DEL, MD [facility]  03/25/2024  9:30 AM [facility]-FTOBGYN NURSE [facility]  03/30/2024  1:45 PM [facility]    Orders Placed This Encounter  Procedures   Culture, beta strep (group b only)   Suzen SAUNDERS Kizzie CNM, [facility] 03/01/2024 11:09 AM  "

## 2024-03-01 NOTE — Patient Instructions (Signed)
 Latasha Peters, thank you for choosing our office today! We appreciate the opportunity to meet your healthcare needs. You may receive a short survey by mail, e-mail, or through [employer]. If you are happy with your care we would appreciate if you could take just a few minutes to complete the survey questions. We read all of your comments and take your feedback very seriously. Thank you again for choosing our office.  Center for [employer] Team at [facility]  [facility] at [facility] ([address] [place], [place] [address]) Entrance C, located off of [address] Free 24/7 valet parking   CLASSES: Go to [email or web address] to register for classes (childbirth, breastfeeding, waterbirth, infant CPR, daddy bootcamp, etc.)  Call the office [phone]) or go to [facility] if: You begin to have strong, frequent contractions Your water breaks.  Sometimes it is a big gush of fluid, sometimes it is just a trickle that keeps getting your panties wet or running down your legs You have vaginal bleeding.  It is normal to have a small amount of spotting if your cervix was checked.  You don't feel your baby moving like normal.  If you don't, get you something to eat and drink and lay down and focus on feeling your baby move.   If your baby is still not moving like normal, you should call the office or go to [facility].  Call the office [phone]) or go to [facility] hospital for these signs of pre-eclampsia: Severe headache that does not go away with Tylenol  Visual changes- seeing spots, double, blurred vision Pain under your right breast or upper abdomen that does not go away with Tums or heartburn medicine Nausea and/or vomiting Severe swelling in your hands, feet, and face   Tdap Vaccine It is recommended that you get the Tdap vaccine during the third trimester of EACH pregnancy to help protect your baby from getting pertussis (whooping cough) 27-36 weeks is the BEST time to do  this so that you can pass the protection on to your baby. During pregnancy is better than after pregnancy, but if you are unable to get it during pregnancy it will be offered at the hospital.  You can get this vaccine with us , at the health department, your family doctor, or some local pharmacies Everyone who will be around your baby should also be up-to-date on their vaccines before the baby comes. Adults (who are not pregnant) only need 1 dose of Tdap during adulthood.   [facility] Pediatricians/Family Doctors [facility]): [address] [place], [phone]           [facility]: [address], [phone]                [facility]): [address], [phone] (call to ask if accepting patients) [facility] Department: [address], [place], [phone]    [facility] Pediatricians/Family Doctors [facility]): [phone] [address], [phone] [facility]: [address] [place], [phone] [facility]: [address], [phone]  [facility] Doctors  [facility]): [phone] [facility]: [address], [phone]   [facility] Doctors [facility]: [address], [phone]  [facility] Family Doctors  [facility]: [phone], [phone]  Home Blood Pressure Monitoring for Patients   Your provider has recommended that you check your  blood pressure (BP) at least once a week at home. If you do not have a blood pressure cuff at home, one will be provided for you. Contact your provider if you have not received your monitor within 1 week.   Helpful Tips for Accurate Home Blood Pressure Checks  Don't smoke, exercise, or drink caffeine 30 minutes before checking your BP Use the restroom before checking your BP (a full bladder can raise your  pressure) Relax in a comfortable upright chair Feet on the ground Left arm resting comfortably on a flat surface at the level of your heart Legs uncrossed Back supported Sit quietly and don't talk Place the cuff on your bare arm Adjust snuggly, so that only two fingertips can fit between your skin and the top of the cuff Check 2 readings separated by at least one minute Keep a log of your BP readings For a visual, please reference this diagram: http://ccnc.care/bpdiagram  Provider Name: [facility]     Phone: [phone]  Zone 1: ALL CLEAR  Continue to monitor your symptoms:  BP reading is less than 140 (top number) or less than 90 (bottom number)  No right upper stomach pain No headaches or seeing spots No feeling nauseated or throwing up No swelling in face and hands  Zone 2: CAUTION Call your doctor's office for any of the following:  BP reading is greater than 140 (top number) or greater than 90 (bottom number)  Stomach pain under your ribs in the middle or right side Headaches or seeing spots Feeling nauseated or throwing up Swelling in face and hands  Zone 3: EMERGENCY  Seek immediate medical care if you have any of the following:  BP reading is greater than160 (top number) or greater than 110 (bottom number) Severe headaches not improving with Tylenol  Serious difficulty catching your breath Any worsening symptoms from Zone 2  Preterm Labor and Birth Information  The normal length of a pregnancy is 39-41 weeks. Preterm labor is when labor starts before 37 completed weeks of pregnancy. What are the risk factors for preterm labor? Preterm labor is more likely to occur in women who: Have certain infections during pregnancy such as a bladder infection, sexually transmitted infection, or infection inside the uterus (chorioamnionitis). Have a shorter-than-normal cervix. Have gone into preterm labor before. Have had surgery on their cervix. Are younger than age 41  or older than age 30. Are African American. Are pregnant with twins or multiple babies (multiple gestation). Take street drugs or smoke while pregnant. Do not gain enough weight while pregnant. Became pregnant shortly after having been pregnant. What are the symptoms of preterm labor? Symptoms of preterm labor include: Cramps similar to those that can happen during a menstrual period. The cramps may happen with diarrhea. Pain in the abdomen or lower back. Regular uterine contractions that may feel like tightening of the abdomen. A feeling of increased pressure in the pelvis. Increased watery or bloody mucus discharge from the vagina. Water breaking (ruptured amniotic sac). Why is it important to recognize signs of preterm labor? It is important to recognize signs of preterm labor because babies who are born prematurely may not be fully developed. This can put them at an increased risk for: Long-term (chronic) heart and lung problems. Difficulty immediately after birth with regulating body systems, including blood sugar, body temperature, heart rate, and breathing rate. Bleeding in the brain. Cerebral palsy. Learning difficulties. Death. These risks are highest for babies who are born before 34 weeks  of pregnancy. How is preterm labor treated? Treatment depends on the length of your pregnancy, your condition, and the health of your baby. It may involve: Having a stitch (suture) placed in your cervix to prevent your cervix from opening too early (cerclage). Taking or being given medicines, such as: Hormone medicines. These may be given early in pregnancy to help support the pregnancy. Medicine to stop contractions. Medicines to help mature the baby's lungs. These may be prescribed if the risk of delivery is high. Medicines to prevent your baby from developing cerebral palsy. If the labor happens before 34 weeks of pregnancy, you may need to stay in the hospital. What should I do if I  think I am in preterm labor? If you think that you are going into preterm labor, call your health care provider right away. How can I prevent preterm labor in future pregnancies? To increase your chance of having a full-term pregnancy: Do not use any tobacco products, such as cigarettes, chewing tobacco, and e-cigarettes. If you need help quitting, ask your health care provider. Do not use street drugs or medicines that have not been prescribed to you during your pregnancy. Talk with your health care provider before taking any herbal supplements, even if you have been taking them regularly. Make sure you gain a healthy amount of weight during your pregnancy. Watch for infection. If you think that you might have an infection, get it checked right away. Make sure to tell your health care provider if you have gone into preterm labor before. This information is not intended to replace advice given to you by your health care provider. Make sure you discuss any questions you have with your health care provider. Document Revised: 06/12/2018 Document Reviewed: 07/12/2015 Elsevier Patient Education  2020 [employer].

## 2024-03-01 NOTE — Progress Notes (Signed)
 US : GA = 36+3 weeks Single active female fetus, cephalic, FHR = 145 bpm, anterior pl, gr2, AFI = 13 cm, MVP = 5.2 cm, BPP = 8/8, RI: 0.53, 0.53, 0.46,  avg: 0.51 25%, cervix appears closed, nl ov's

## 2024-03-02 LAB — CERVICOVAGINAL ANCILLARY ONLY
Chlamydia: NEGATIVE
Comment: NEGATIVE
Comment: NORMAL
Neisseria Gonorrhea: NEGATIVE

## 2024-03-04 NOTE — L&D Delivery Note (Signed)
 OB/GYN Faculty Practice Delivery Note  Latasha Peters is a 24 y.o. H5E6986 s/p NVD at [redacted]w[redacted]d. She was admitted for IOL FGR 9.9%.   ROM: 16h 68m with clear fluid GBS Status: Negative/-- (12/29 1715)  Maximum Maternal Temperature: 98.3  Labor Progress: Initial SVE: 3/50/-3. AROM, Pitocin , and Cytotec . She then progressed to complete.   Delivery Date/Time: 03/16/2024 5:59 PM Delivery: Called to room and patient was complete and pushing. Head delivered right occiput anterior. nuchal cord absent. Shoulder and body delivered in usual fashion. Infant with spontaneous cry, placed on mother's abdomen, dried and stimulated. Cord clamped x 2 after 1-minute delay, and cut by father. Cord blood collected . Placenta delivered spontaneously with gentle cord traction. Fundus firm with massage and Pitocin . Labia, perineum, vagina, and cervix were inspected and found to be intact.  Placenta:  spontaneous Intact Placenta to L&D Complications:none Lacerations: none QBL: 97mL Analgesia: Epidural  Newborn Data: Living status:Living Gender:Female Apgars:8 ,9  Weight:2780 g           Barabara Maier, DO FM-OB Fellow Center for [employer]

## 2024-03-05 LAB — CULTURE, BETA STREP (GROUP B ONLY): Strep Gp B Culture: NEGATIVE

## 2024-03-08 ENCOUNTER — Ambulatory Visit: Admitting: Obstetrics & Gynecology

## 2024-03-08 ENCOUNTER — Ambulatory Visit (INDEPENDENT_AMBULATORY_CARE_PROVIDER_SITE_OTHER): Admitting: Radiology

## 2024-03-08 VITALS — BP 115/75 | HR 101 | Wt 185.0 lb

## 2024-03-08 DIAGNOSIS — G935 Compression of brain: Secondary | ICD-10-CM | POA: Diagnosis not present

## 2024-03-08 DIAGNOSIS — O99353 Diseases of the nervous system complicating pregnancy, third trimester: Secondary | ICD-10-CM | POA: Diagnosis not present

## 2024-03-08 DIAGNOSIS — O365930 Maternal care for other known or suspected poor fetal growth, third trimester, not applicable or unspecified: Secondary | ICD-10-CM

## 2024-03-08 DIAGNOSIS — O0993 Supervision of high risk pregnancy, unspecified, third trimester: Secondary | ICD-10-CM

## 2024-03-08 DIAGNOSIS — Z6841 Body Mass Index (BMI) 40.0 and over, adult: Secondary | ICD-10-CM

## 2024-03-08 DIAGNOSIS — Z3A37 37 weeks gestation of pregnancy: Secondary | ICD-10-CM

## 2024-03-08 DIAGNOSIS — Z3A35 35 weeks gestation of pregnancy: Secondary | ICD-10-CM | POA: Diagnosis not present

## 2024-03-08 DIAGNOSIS — O365990 Maternal care for other known or suspected poor fetal growth, unspecified trimester, not applicable or unspecified: Secondary | ICD-10-CM

## 2024-03-08 NOTE — Progress Notes (Signed)
 US : GA = 37+3 weeks Single active female fetus, FHR = 158 bpm, anterior pl, gr2, AFI = 10.1 cm, MVP = 3.8 cm, BPP = 8/8, RI: 0.52, 0.44  avg: 0.48  17%, EFW 2587g, 10%,  AC 7.9%, cx closed

## 2024-03-08 NOTE — Progress Notes (Unsigned)
 "   HIGH-RISK PREGNANCY VISIT Patient name: Latasha Peters MRN [id number]  Date of birth: 10/03/2000 Chief Complaint:   Routine Prenatal Visit  History of Present Illness:   Latasha Peters is a 24 y.o. H5E7987 female at [redacted]w[redacted]d with an Estimated Date of Delivery: 03/26/24 being seen today for ongoing management of a high-risk pregnancy complicated by     ICD-10-CM   1. Encounter for supervision of high risk pregnancy in third trimester, antepartum  O09.93     2. [redacted] weeks gestation of pregnancy  Z3A.37     3. Fetal growth restriction antepartum, 10% normal UAD AC 8%  O36.5990     4. 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    Today she reports {pregnancy symptoms:25616::no complaints}. Contractions: Not present. Vag. Bleeding: None.  Movement: (!) Decreased. {Actions; denies-reports:120008} leaking of fluid.      01/15/2024    2:06 PM 12/30/2023   11:47 AM 09/15/2023    2:35 PM  Depression screen PHQ 2/9  Decreased Interest 0 0 0  Down, Depressed, Hopeless 0 1 0  PHQ - 2 Score 0 1 0  Altered sleeping 3 3 0  Tired, decreased energy 3 3 3   Change in appetite 0 3 1  Feeling bad or failure about yourself  0 1 0  Trouble concentrating 1 3 1   Moving slowly or fidgety/restless 0 3 0  Suicidal thoughts 0 0 0  PHQ-9 Score 7 17  5       Data saved with a previous flowsheet row definition        01/15/2024    2:07 PM 12/30/2023   11:50 AM 09/15/2023    2:35 PM  GAD 7 : Generalized Anxiety Score  Nervous, Anxious, on Edge 1 3 0  Control/stop worrying 1 1 0  Worry too much - different things 0 3 0  Trouble relaxing 1 1 0  Restless 0 3 0  Easily annoyed or irritable 1 0 0  Afraid - awful might happen 1 3 2   Total GAD 7 Score 5 14 2      Review of Systems:   Pertinent items are noted in HPI Denies abnormal vaginal discharge w/ itching/odor/irritation, headaches, visual changes, shortness of breath, chest pain, abdominal pain, severe nausea/vomiting,  or problems with urination or bowel movements unless otherwise stated above. Pertinent History Reviewed:  Reviewed past medical,surgical, social, obstetrical and family history.  Reviewed problem list, medications and allergies. Physical Assessment:   Vitals:   03/08/24 1106  BP: 115/75  Pulse: (!) 101  Weight: 185 lb (83.9 kg)  Body mass index is 32.77 kg/m.           Physical Examination:   General appearance: {:315021}  Mental status: {:313008}  Skin: warm & dry   Extremities:      Cardiovascular: normal heart rate noted  Respiratory: normal respiratory effort, no distress  Abdomen: gravid, soft, non-tender  Pelvic: {Blank single:19197::Cervical exam performed,Cervical exam deferred}         Fetal Status:     Movement: (!) Decreased    Fetal Surveillance Testing today: ***   Chaperone: {Chaperone:19197::N/A,pt declined,Latisha Cresenzo,Janet Young,Amanda Andrews,Peggy Dones,Caroline Preston}    No results found for this or any previous visit (from the past 24 hours).  Assessment & Plan:  High-risk pregnancy: H5E7987 at [redacted]w[redacted]d with an Estimated Date of Delivery: 03/26/24      ICD-10-CM   1. Encounter for supervision of  high risk pregnancy in third trimester, antepartum  O09.93     2. [redacted] weeks gestation of pregnancy  Z3A.37     3. Fetal growth restriction antepartum, 10% normal UAD AC 8%  O36.5990     4. 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   Orders: No orders of the defined types were placed in this encounter.    Labs/procedures today: {ob lab/procedures:25214}  Treatment Plan:  ***  Reviewed: {Blank single:19197::Term,Preterm} labor symptoms and general obstetric precautions including but not limited to vaginal bleeding, contractions, leaking of fluid and fetal movement were reviewed in detail with the patient.  All questions were  answered. {does does not:25387::Does} have home bp cuff. Office bp cuff given: {yes/no/default n/a:21102::not applicable}. Check bp {weekly daily:25388::weekly}, let us  know if consistently {pregnant bp:25389::>140 and/or >90}.  Follow-up: No follow-ups on file.   Future Appointments  Date Time Provider Department Center  03/11/2024 10:50 AM [facility] NURSE [facility]  03/15/2024 10:00 AM [facility] - FT IMG 2 [facility]FT[facility]G None  03/15/2024 10:50 AM Kizzie Suzen SAUNDERS, CNM [facility]  03/18/2024  9:50 AM [facility] NURSE [facility]  03/22/2024 10:00 AM [facility] - FT IMG 2 [facility]FTIMG None  03/22/2024 10:50 AM Jayne Vonn DEL, MD [facility]  03/25/2024  9:30 AM [facility] NURSE [facility]  03/30/2024  1:45 PM [facility]    No orders of the defined types were placed in this encounter.  Vonn DEL Jayne  Attending Physician for the Center for [facility] 03/08/2024 11:48 AM  "

## 2024-03-09 DIAGNOSIS — Z3480 Encounter for supervision of other normal pregnancy, unspecified trimester: Secondary | ICD-10-CM

## 2024-03-09 DIAGNOSIS — O0993 Supervision of high risk pregnancy, unspecified, third trimester: Secondary | ICD-10-CM

## 2024-03-09 DIAGNOSIS — O365990 Maternal care for other known or suspected poor fetal growth, unspecified trimester, not applicable or unspecified: Secondary | ICD-10-CM

## 2024-03-09 DIAGNOSIS — Z3A38 38 weeks gestation of pregnancy: Secondary | ICD-10-CM

## 2024-03-11 ENCOUNTER — Ambulatory Visit: Admitting: *Deleted

## 2024-03-11 VITALS — BP 109/64 | HR 82

## 2024-03-11 DIAGNOSIS — Z3A37 37 weeks gestation of pregnancy: Secondary | ICD-10-CM

## 2024-03-11 DIAGNOSIS — O365930 Maternal care for other known or suspected poor fetal growth, third trimester, not applicable or unspecified: Secondary | ICD-10-CM | POA: Diagnosis not present

## 2024-03-11 DIAGNOSIS — O0993 Supervision of high risk pregnancy, unspecified, third trimester: Secondary | ICD-10-CM

## 2024-03-11 DIAGNOSIS — O365990 Maternal care for other known or suspected poor fetal growth, unspecified trimester, not applicable or unspecified: Secondary | ICD-10-CM

## 2024-03-11 NOTE — Progress Notes (Addendum)
" ° °  NURSE VISIT- NST  SUBJECTIVE:  Latasha Peters is a 24 y.o. [phone] female at [redacted]w[redacted]d, here for a NST for pregnancy complicated by FGR.  She reports active fetal movement, contractions: none, vaginal bleeding: none, membranes: intact.   OBJECTIVE:  BP 109/64   Pulse 82   LMP 05/25/2023 (Approximate)   Appears well, no apparent distress  No results found for this or any previous visit (from the past 24 hours).  NST: FHR baseline 140 bpm, Variability: moderate, Accelerations:present, Decelerations:  Absent= Cat 1/reactive Toco: none   ASSESSMENT: H5E7987 at [redacted]w[redacted]d with FGR NST reactive  PLAN: EFM strip reviewed by Sherrell Ely, CNM   Recommendations: keep next appointment as scheduled    Alan LITTIE Fischer  03/11/2024 11:15 AM   Chart reviewed for nurse visit. Agree with plan of care.  Ely Mering, [place] 03/11/2024 3:31 PM   "

## 2024-03-15 ENCOUNTER — Inpatient Hospital Stay (HOSPITAL_COMMUNITY): Admission: AD | Admit: 2024-03-15 | Source: Home / Self Care | Admitting: Obstetrics and Gynecology

## 2024-03-15 ENCOUNTER — Other Ambulatory Visit: Payer: Self-pay

## 2024-03-15 ENCOUNTER — Ambulatory Visit: Admitting: Women's Health

## 2024-03-15 ENCOUNTER — Ambulatory Visit: Admitting: Radiology

## 2024-03-15 ENCOUNTER — Inpatient Hospital Stay (HOSPITAL_COMMUNITY)
Admission: AD | Admit: 2024-03-15 | Discharge: 2024-03-17 | DRG: 805 | Disposition: A | Discharge status: Home / Self Care | Attending: Obstetrics and Gynecology | Admitting: Obstetrics and Gynecology

## 2024-03-15 ENCOUNTER — Encounter: Payer: Self-pay | Admitting: Women's Health

## 2024-03-15 VITALS — BP 120/80 | HR 84 | Wt 190.0 lb

## 2024-03-15 DIAGNOSIS — O99892 Other specified diseases and conditions complicating childbirth: Secondary | ICD-10-CM | POA: Diagnosis present

## 2024-03-15 DIAGNOSIS — Z3A38 38 weeks gestation of pregnancy: Secondary | ICD-10-CM

## 2024-03-15 DIAGNOSIS — O368130 Decreased fetal movements, third trimester, not applicable or unspecified: Secondary | ICD-10-CM | POA: Diagnosis not present

## 2024-03-15 DIAGNOSIS — O365930 Maternal care for other known or suspected poor fetal growth, third trimester, not applicable or unspecified: Secondary | ICD-10-CM

## 2024-03-15 DIAGNOSIS — O26893 Other specified pregnancy related conditions, third trimester: Secondary | ICD-10-CM | POA: Diagnosis present

## 2024-03-15 DIAGNOSIS — O365990 Maternal care for other known or suspected poor fetal growth, unspecified trimester, not applicable or unspecified: Secondary | ICD-10-CM

## 2024-03-15 DIAGNOSIS — O09893 Supervision of other high risk pregnancies, third trimester: Secondary | ICD-10-CM | POA: Diagnosis not present

## 2024-03-15 DIAGNOSIS — Z6791 Unspecified blood type, Rh negative: Secondary | ICD-10-CM | POA: Diagnosis not present

## 2024-03-15 DIAGNOSIS — Z8249 Family history of ischemic heart disease and other diseases of the circulatory system: Secondary | ICD-10-CM

## 2024-03-15 DIAGNOSIS — Z3490 Encounter for supervision of normal pregnancy, unspecified, unspecified trimester: Secondary | ICD-10-CM

## 2024-03-15 DIAGNOSIS — Z87891 Personal history of nicotine dependence: Secondary | ICD-10-CM

## 2024-03-15 DIAGNOSIS — O0990 Supervision of high risk pregnancy, unspecified, unspecified trimester: Principal | ICD-10-CM

## 2024-03-15 DIAGNOSIS — O0993 Supervision of high risk pregnancy, unspecified, third trimester: Secondary | ICD-10-CM

## 2024-03-15 DIAGNOSIS — G935 Compression of brain: Secondary | ICD-10-CM | POA: Diagnosis present

## 2024-03-15 DIAGNOSIS — O26899 Other specified pregnancy related conditions, unspecified trimester: Secondary | ICD-10-CM

## 2024-03-15 MED ORDER — FLEET ENEMA RE ENEM: 1.0000 | ENEMA | RECTAL | Status: DC | PRN

## 2024-03-15 MED ORDER — OXYTOCIN BOLUS FROM INFUSION
333.0000 mL | Freq: Once | INTRAVENOUS | Status: AC
  Administered 2024-03-16: 333 mL via INTRAVENOUS

## 2024-03-15 MED ORDER — ONDANSETRON HCL 4 MG/2ML IJ SOLN
4.0000 mg | Freq: Four times a day (QID) | INTRAMUSCULAR | Status: DC | PRN
  Administered 2024-03-16: 4 mg via INTRAVENOUS
  Filled 2024-03-15: qty 2

## 2024-03-15 MED ORDER — ACETAMINOPHEN 325 MG PO TABS: 650.0000 mg | ORAL_TABLET | ORAL | Status: DC | PRN

## 2024-03-15 MED ORDER — SOD CITRATE-CITRIC ACID 500-334 MG/5ML PO SOLN: 30.0000 mL | ORAL | Status: DC | PRN

## 2024-03-15 MED ORDER — OXYTOCIN-SODIUM CHLORIDE 30-0.9 UT/500ML-% IV SOLN: 2.5000 [IU]/h | INTRAVENOUS | Status: DC

## 2024-03-15 MED ORDER — OXYCODONE-ACETAMINOPHEN 5-325 MG PO TABS: 1.0000 | ORAL_TABLET | ORAL | Status: DC | PRN

## 2024-03-15 MED ORDER — LACTATED RINGERS IV SOLN: 500.0000 mL | INTRAVENOUS | Status: DC | PRN

## 2024-03-15 MED ORDER — LACTATED RINGERS IV SOLN: INTRAVENOUS | Status: DC

## 2024-03-15 MED ORDER — FENTANYL CITRATE (PF) 100 MCG/2ML IJ SOLN
100.0000 ug | INTRAMUSCULAR | Status: DC | PRN
  Administered 2024-03-16: 100 ug via INTRAVENOUS
  Filled 2024-03-15: qty 2

## 2024-03-15 MED ORDER — LIDOCAINE HCL (PF) 1 % IJ SOLN: 30.0000 mL | INTRAMUSCULAR | Status: DC | PRN

## 2024-03-15 MED ORDER — OXYCODONE-ACETAMINOPHEN 5-325 MG PO TABS: 2.0000 | ORAL_TABLET | ORAL | Status: DC | PRN

## 2024-03-15 MED ORDER — HYDROXYZINE HCL 50 MG PO TABS: 50.0000 mg | ORAL_TABLET | Freq: Four times a day (QID) | ORAL | Status: DC | PRN

## 2024-03-15 NOTE — Progress Notes (Addendum)
 US : GA = 38+3 weeks Single active female fetus, FHR = 145 bpm, anterior pl, gr2, AFI = 10.9 cm, MVP = 4.2 cm, BPP = 8/8, RI: 0.50, 0.53  35%,  cervix closed

## 2024-03-15 NOTE — MAU Note (Signed)
 Latasha Peters is a 24 y.o. at [redacted]w[redacted]d here in MAU reporting: here for IOL - was seen in the office today c/o DFM. Reports pelvic pressure. Denies VB or LOF. Last movement from baby was around 2000  Pain score: 4 Vitals:   03/15/24 2333  BP: 111/70  Pulse: 79  Resp: 16  Temp: 98.1 F (36.7 C)  SpO2: 100%     FHT: 134  Lab orders placed from triage: none

## 2024-03-15 NOTE — Progress Notes (Addendum)
 "  HIGH-RISK PREGNANCY VISIT Patient name: Latasha Peters MRN [id number]  Date of birth: January 22, 2001 Chief Complaint:   Routine Prenatal Visit  History of Present Illness:   Latasha Peters is a 24 y.o. H5E7987 female at [redacted]w[redacted]d with an Estimated Date of Delivery: 03/26/24 being seen today for ongoing management of a high-risk pregnancy complicated by fetal growth restriction EFW 9.9%, AC 7.9%.    Today she reports decreased fetal movement, only x4 yesterday, x2 this am. Contractions: Irritability. Vag. Bleeding: None.  Movement: (!) Decreased. denies leaking of fluid.      01/15/2024    2:06 PM 12/30/2023   11:47 AM 09/15/2023    2:35 PM  Depression screen PHQ 2/9  Decreased Interest 0 0 0  Down, Depressed, Hopeless 0 1 0  PHQ - 2 Score 0 1 0  Altered sleeping 3 3 0  Tired, decreased energy 3 3 3   Change in appetite 0 3 1  Feeling bad or failure about yourself  0 1 0  Trouble concentrating 1 3 1   Moving slowly or fidgety/restless 0 3 0  Suicidal thoughts 0 0 0  PHQ-9 Score 7 17  5       Data saved with a previous flowsheet row definition        01/15/2024    2:07 PM 12/30/2023   11:50 AM 09/15/2023    2:35 PM  GAD 7 : Generalized Anxiety Score  Nervous, Anxious, on Edge 1 3 0  Control/stop worrying 1 1 0  Worry too much - different things 0 3 0  Trouble relaxing 1 1 0  Restless 0 3 0  Easily annoyed or irritable 1 0 0  Afraid - awful might happen 1 3 2   Total GAD 7 Score 5 14 2      Review of Systems:   Pertinent items are noted in HPI Denies abnormal vaginal discharge w/ itching/odor/irritation, headaches, visual changes, shortness of breath, chest pain, abdominal pain, severe nausea/vomiting, or problems with urination or bowel movements unless otherwise stated above. Pertinent History Reviewed:  Reviewed past medical,surgical, social, obstetrical and family history.  Reviewed problem list, medications and allergies. Physical Assessment:   Vitals:   03/15/24 1034  BP:  120/80  Pulse: 84  Weight: 190 lb (86.2 kg)  Body mass index is 33.66 kg/m.           Physical Examination:   General appearance: alert, well appearing, and in no distress  Mental status: alert, oriented to person, place, and time  Skin: warm & dry   Extremities: Edema: None    Cardiovascular: normal heart rate noted  Respiratory: normal respiratory effort, no distress  Abdomen: gravid, soft, non-tender  Pelvic: Cervical exam deferred         Fetal Status:     Movement: (!) Decreased    Fetal Surveillance Testing today: US : GA = 38+3 weeks Single active female fetus, FHR = 145 bpm, anterior pl, gr2, AFI = 10.9 cm, MVP = 4.2 cm, BPP = 8/8, RI: 0.50, 0.53  35%,  cervix closed  Chaperone: N/A  No results found for this or any previous visit (from the past 24 hours).  Assessment & Plan:  High-risk pregnancy: H5E7987 at [redacted]w[redacted]d with an Estimated Date of Delivery: 03/26/24   1) FGR AC 7.9% (EFW 9.9%), last EFW 37.3w, today BPP 8/8, UAD 35%  2) DFM, yesterday, a little better this am, bpp 8/8 today w/ normal UAD. IOL scheduled for tonight at midnight (FGR), doesn't have childcare until  tonight- earliest she can get to hospital is ~2115. Discussed w/ L&D charge, ok for her to come then instead of midnight.  IOL form faxed and orders placed. Reviewed FKC/reasons to seek care before midnight.   Meds: No orders of the defined types were placed in this encounter.   Labs/procedures today: U/S  Treatment Plan:     UAD EFW Testing Delivery  EFW/AC 3-9% Q 1-2wk Q 3wk 28w-weekly BPP 32w-add NST 38.0-39.0    Reviewed: Term labor symptoms and general obstetric precautions including but not limited to vaginal bleeding, contractions, leaking of fluid and fetal movement were reviewed in detail with the patient.  All questions were answered. Does have home bp cuff. Office bp cuff given: not applicable. Check bp daily, let us  know if consistently >140 and/or >90.  Follow-up: Return for cancel  appts.   Future Appointments  Date Time Provider Department Center  03/16/2024 12:00 AM [facility]-LD SCHED ROOM [facility]-INDC None  03/18/2024  9:50 AM [facility]FTOBGYN NURSE [facility]  03/22/2024 10:00 AM [facility] - FT IMG 2 [facility]-FTIMG None  03/22/2024 10:50 AM Jayne Vonn DEL, MD [facility]  03/25/2024  9:30 AM [facility]GYN NURSE [facility]TO[facility]YN  03/30/2024  1:45 PM [facility] CLIN[facility]IAN [facility]    No orders of the defined types were placed in this encounter.  Suzen JONELLE Fetters CNM, [facility] 03/15/2024 11:25 AM  "

## 2024-03-15 NOTE — Patient Instructions (Signed)
 [place], thank you for choosing our office today! We appreciate the opportunity to meet your healthcare needs. You may receive a short survey by mail, e-mail, or through [employer]. If you are happy with your care we would appreciate if you could take just a few minutes to complete the survey questions. We read all of your comments and take your feedback very seriously. Thank you again for choosing our office.  Center for [employer] Team at [facility]  [facility] at [facility] ([address] [place], [place] [address]) Entrance C, located off of [address] Free 24/7 valet parking    Your induction is scheduled for 1/13 at midnight. You should be there on 1/12 (TONIGHT) at 11:45pm  Go to the main desk at the [facility] and let them know you are there to be induced. They will send someone from Labor & Delivery to come get you.  You will get a call from a nurse from the hospital within the next day or so to go over some information. If you have any questions, please let us  know.   CLASSES: Go to [email or web address] to register for classes (childbirth, breastfeeding, waterbirth, infant CPR, daddy bootcamp, etc.)  Call the office [phone]) or go to [facility] if: You begin to have strong, frequent contractions Your water breaks.  Sometimes it is a big gush of fluid, sometimes it is just a trickle that keeps getting your panties wet or running down your legs You have vaginal bleeding.  It is normal to have a small amount of spotting if your cervix was checked.  You don't feel your baby moving like normal.  If you don't, get you something to eat and drink and lay down and focus on feeling your baby move.   If your baby is still not moving like normal, you should call the office or go to [facility].  Call the office [phone]) or go to [facility] hospital for these signs of pre-eclampsia: Severe headache that does not go away with Tylenol  Visual changes-  seeing spots, double, blurred vision Pain under your right breast or upper abdomen that does not go away with Tums or heartburn medicine Nausea and/or vomiting Severe swelling in your hands, feet, and face   [place] [facility]ians/Family Doctors [facility]): [address] [place]OCKS, [phone]           [facility]: [address], [phone]                [facility]): [address], [phone] (call to ask if accepting patients) [facility] Department: [address] [place], [phone]    [facility] Pediatricians/Family Doctors [facility]): [address], [phone] [facility]: [address] [place], [phone] [facility]: [address], [phone]  [facility] Doctors  [facility]): [phone] [facility]: [address], [phone]   [facility] Doctors [facility]: [address] [phone]  [facility] Doctors  [facility]: [phone], [phone]  Home Blood Pressure Monitoring for Patients   Your provider has recommended that you check your blood pressure (BP) at least once a week at home. If you do not have a blood pressure cuff at home, one will be provided for you. Contact your provider if you have not received your monitor within  1 week.   Helpful Tips for Accurate Home Blood Pressure Checks  Don't smoke, exercise, or drink caffeine  30 minutes before checking your BP Use the restroom before checking your BP (a full bladder can raise your pressure) Relax in a comfortable upright chair Feet on the ground Left arm resting comfortably on a flat surface at the level of your heart Legs uncrossed Back supported Sit quietly and don't talk Place the cuff on your bare arm Adjust snuggly, so that only two  fingertips can fit between your skin and the top of the cuff Check 2 readings separated by at least one minute Keep a log of your BP readings For a visual, please reference this diagram: http://ccnc.care/bpdiagram  Provider Name: [facility]     Phone: [phone]  Zone 1: ALL CLEAR  Continue to monitor your symptoms:  BP reading is less than 140 (top number) or less than 90 (bottom number)  No right upper stomach pain No headaches or seeing spots No feeling nauseated or throwing up No swelling in face and hands  Zone 2: CAUTION Call your doctor's office for any of the following:  BP reading is greater than 140 (top number) or greater than 90 (bottom number)  Stomach pain under your ribs in the middle or right side Headaches or seeing spots Feeling nauseated or throwing up Swelling in face and hands  Zone 3: EMERGENCY  Seek immediate medical care if you have any of the following:  BP reading is greater than160 (top number) or greater than 110 (bottom number) Severe headaches not improving with Tylenol  Serious difficulty catching your breath Any worsening symptoms from Zone 2   Braxton Hicks Contractions Contractions of the uterus can occur throughout pregnancy, but they are not always a sign that you are in labor. You may have practice contractions called Braxton Hicks contractions. These false labor contractions are sometimes confused with true labor. What are Darol Irving contractions? Braxton Hicks contractions are tightening movements that occur in the muscles of the uterus before labor. Unlike true labor contractions, these contractions do not result in opening (dilation) and thinning of the cervix. Toward the end of pregnancy (32-34 weeks), Braxton Hicks contractions can happen more often and may become stronger. These contractions are sometimes difficult to tell apart from true labor because they can be very uncomfortable. You should not feel embarrassed if you go to  the hospital with false labor. Sometimes, the only way to tell if you are in true labor is for your health care provider to look for changes in the cervix. The health care provider will do a physical exam and may monitor your contractions. If you are not in true labor, the exam should show that your cervix is not dilating and your water has not broken. If there are no other health problems associated with your pregnancy, it is completely safe for you to be sent home with false labor. You may continue to have Braxton Hicks contractions until you go into true labor. How to tell the difference between true labor and false labor True labor Contractions last 30-70 seconds. Contractions become very regular. Discomfort is usually felt in the top of the uterus, and it spreads to the lower abdomen and low back. Contractions do not go away with walking. Contractions usually become more intense and increase in frequency. The cervix dilates and gets thinner. False labor Contractions are usually shorter and not as strong as true labor contractions. Contractions are usually irregular. Contractions are often  felt in the front of the lower abdomen and in the groin. Contractions may go away when you walk around or change positions while lying down. Contractions get weaker and are shorter-lasting as time goes on. The cervix usually does not dilate or become thin. Follow these instructions at home:  Take over-the-counter and prescription medicines only as told by your health care provider. Keep up with your usual exercises and follow other instructions from your health care provider. Eat and drink lightly if you think you are going into labor. If Braxton Hicks contractions are making you uncomfortable: Change your position from lying down or resting to walking, or change from walking to resting. Sit and rest in a tub of warm water. Drink enough fluid to keep your urine pale yellow. Dehydration may cause these  contractions. Do slow and deep breathing several times an hour. Keep all follow-up prenatal visits as told by your health care provider. This is important. Contact a health care provider if: You have a fever. You have continuous pain in your abdomen. Get help right away if: Your contractions become stronger, more regular, and closer together. You have fluid leaking or gushing from your vagina. You pass blood-tinged mucus (bloody show). You have bleeding from your vagina. You have low back pain that you never had before. You feel your babys head pushing down and causing pelvic pressure. Your baby is not moving inside you as much as it used to. Summary Contractions that occur before labor are called Braxton Hicks contractions, false labor, or practice contractions. Braxton Hicks contractions are usually shorter, weaker, farther apart, and less regular than true labor contractions. True labor contractions usually become progressively stronger and regular, and they become more frequent. Manage discomfort from [facility] contractions by changing position, resting in a warm bath, drinking plenty of water, or practicing deep breathing. This information is not intended to replace advice given to you by your health care provider. Make sure you discuss any questions you have with your health care provider. Document Revised: 01/31/2017 Document Reviewed: 07/04/2016 Elsevier Patient Education  2020 [employer].

## 2024-03-16 ENCOUNTER — Other Ambulatory Visit: Payer: Self-pay

## 2024-03-16 ENCOUNTER — Inpatient Hospital Stay (HOSPITAL_COMMUNITY): Admitting: Anesthesiology

## 2024-03-16 ENCOUNTER — Encounter (HOSPITAL_COMMUNITY)

## 2024-03-16 ENCOUNTER — Encounter (HOSPITAL_COMMUNITY): Payer: Self-pay | Admitting: Obstetrics & Gynecology

## 2024-03-16 LAB — SYPHILIS: RPR W/REFLEX TO RPR TITER AND TREPONEMAL ANTIBODIES, TRADITIONAL SCREENING AND DIAGNOSIS ALGORITHM: RPR Ser Ql: NONREACTIVE

## 2024-03-16 LAB — TYPE AND SCREEN
ABO/RH(D): O NEG
Antibody Screen: POSITIVE

## 2024-03-16 LAB — CBC
HCT: 33.4 % — ABNORMAL LOW (ref 36.0–46.0)
Hemoglobin: 11.1 g/dL — ABNORMAL LOW (ref 12.0–15.0)
MCH: 28.2 pg (ref 26.0–34.0)
MCHC: 33.2 g/dL (ref 30.0–36.0)
MCV: 84.8 fL (ref 80.0–100.0)
Platelets: 262 K/uL (ref 150–400)
RBC: 3.94 MIL/uL (ref 3.87–5.11)
RDW: 14 % (ref 11.5–15.5)
WBC: 14.2 K/uL — ABNORMAL HIGH (ref 4.0–10.5)
nRBC: 0 % (ref 0.0–0.2)

## 2024-03-16 MED ORDER — EPHEDRINE 5 MG/ML INJ: 10.0000 mg | INTRAVENOUS | Status: DC | PRN

## 2024-03-16 MED ORDER — COCONUT OIL OIL: 1.0000 | TOPICAL_OIL | Status: DC | PRN

## 2024-03-16 MED ORDER — TERBUTALINE SULFATE 1 MG/ML IJ SOLN: 0.2500 mg | Freq: Once | INTRAMUSCULAR | Status: DC | PRN

## 2024-03-16 MED ORDER — ONDANSETRON HCL 4 MG/2ML IJ SOLN: 4.0000 mg | INTRAMUSCULAR | Status: DC | PRN

## 2024-03-16 MED ORDER — ACETAMINOPHEN 500 MG PO TABS
1000.0000 mg | ORAL_TABLET | Freq: Four times a day (QID) | ORAL | Status: DC
  Administered 2024-03-16 – 2024-03-17 (×4): 1000 mg via ORAL
  Filled 2024-03-16 (×5): qty 2

## 2024-03-16 MED ORDER — OXYTOCIN-SODIUM CHLORIDE 30-0.9 UT/500ML-% IV SOLN
1.0000 m[IU]/min | INTRAVENOUS | Status: DC
  Administered 2024-03-16: 2 m[IU]/min via INTRAVENOUS
  Filled 2024-03-16: qty 500

## 2024-03-16 MED ORDER — MISOPROSTOL 50MCG HALF TABLET
50.0000 ug | ORAL_TABLET | ORAL | Status: DC | PRN
  Administered 2024-03-16: 50 ug via ORAL
  Filled 2024-03-16: qty 1

## 2024-03-16 MED ORDER — WITCH HAZEL-GLYCERIN EX PADS: 1.0000 | MEDICATED_PAD | CUTANEOUS | Status: DC | PRN

## 2024-03-16 MED ORDER — SIMETHICONE 80 MG PO CHEW: 80.0000 mg | CHEWABLE_TABLET | ORAL | Status: DC | PRN

## 2024-03-16 MED ORDER — ONDANSETRON HCL 4 MG PO TABS: 4.0000 mg | ORAL_TABLET | ORAL | Status: DC | PRN

## 2024-03-16 MED ORDER — IBUPROFEN 600 MG PO TABS
600.0000 mg | ORAL_TABLET | Freq: Four times a day (QID) | ORAL | Status: DC
  Administered 2024-03-16 – 2024-03-17 (×4): 600 mg via ORAL
  Filled 2024-03-16 (×5): qty 1

## 2024-03-16 MED ORDER — BENZOCAINE-MENTHOL 20-0.5 % EX AERO: 1.0000 | INHALATION_SPRAY | CUTANEOUS | Status: DC | PRN

## 2024-03-16 MED ORDER — SENNOSIDES-DOCUSATE SODIUM 8.6-50 MG PO TABS
2.0000 | ORAL_TABLET | Freq: Every evening | ORAL | Status: DC | PRN
  Administered 2024-03-17: 2 via ORAL
  Filled 2024-03-16: qty 2

## 2024-03-16 MED ORDER — LIDOCAINE HCL (PF) 1 % IJ SOLN
INTRAMUSCULAR | Status: DC | PRN
  Administered 2024-03-16: 11 mL via EPIDURAL

## 2024-03-16 MED ORDER — MEASLES, MUMPS & RUBELLA VAC ~~LOC~~ SUSR
0.5000 mL | Freq: Once | SUBCUTANEOUS | Status: AC
  Administered 2024-03-17: 0.5 mL via SUBCUTANEOUS

## 2024-03-16 MED ORDER — MAGNESIUM HYDROXIDE 400 MG/5ML PO SUSP: 30.0000 mL | Freq: Every day | ORAL | Status: DC | PRN

## 2024-03-16 MED ORDER — LACTATED RINGERS IV SOLN: 500.0000 mL | Freq: Once | INTRAVENOUS | Status: DC

## 2024-03-16 MED ORDER — TETANUS-DIPHTH-ACELL PERTUSSIS 5-2-15.5 LF-MCG/0.5 IM SUSP: 0.5000 mL | Freq: Once | INTRAMUSCULAR | Status: DC

## 2024-03-16 MED ORDER — OXYTOCIN-SODIUM CHLORIDE 30-0.9 UT/500ML-% IV SOLN: 1.0000 m[IU]/min | INTRAVENOUS | Status: DC

## 2024-03-16 MED ORDER — PHENYLEPHRINE 80 MCG/ML (10ML) SYRINGE FOR IV PUSH (FOR BLOOD PRESSURE SUPPORT): 80.0000 ug | PREFILLED_SYRINGE | INTRAVENOUS | Status: DC | PRN

## 2024-03-16 MED ORDER — PRENATAL MULTIVITAMIN CH: 1.0000 | ORAL_TABLET | Freq: Every day | ORAL | Status: DC

## 2024-03-16 MED ORDER — DIPHENHYDRAMINE HCL 50 MG/ML IJ SOLN: 12.5000 mg | INTRAMUSCULAR | Status: DC | PRN

## 2024-03-16 MED ORDER — FENTANYL-BUPIVACAINE-NACL 0.5-0.125-0.9 MG/250ML-% EP SOLN
12.0000 mL/h | EPIDURAL | Status: DC | PRN
  Administered 2024-03-16: 12 mL/h via EPIDURAL
  Filled 2024-03-16: qty 250

## 2024-03-16 MED ORDER — DIPHENHYDRAMINE HCL 25 MG PO CAPS: 25.0000 mg | ORAL_CAPSULE | Freq: Four times a day (QID) | ORAL | Status: DC | PRN

## 2024-03-16 MED ORDER — MEDROXYPROGESTERONE ACETATE 150 MG/ML IM SUSP: 150.0000 mg | INTRAMUSCULAR | Status: DC | PRN

## 2024-03-16 MED ORDER — DIBUCAINE (PERIANAL) 1 % EX OINT: 1.0000 | TOPICAL_OINTMENT | CUTANEOUS | Status: DC | PRN

## 2024-03-16 NOTE — Anesthesia Preprocedure Evaluation (Addendum)
Anesthesia Evaluation  Patient identified by MRN, date of birth, ID band Patient awake    Reviewed: Allergy & Precautions, H&P , NPO status , Patient's Chart, lab work & pertinent test results  Airway Mallampati: II  TM Distance: >3 FB Neck ROM: Full    Dental no notable dental hx.    Pulmonary neg pulmonary ROS, former smoker   Pulmonary exam normal breath sounds clear to auscultation       Cardiovascular negative cardio ROS Normal cardiovascular exam Rhythm:Regular Rate:Normal     Neuro/Psych negative neurological ROS  negative psych ROS   GI/Hepatic negative GI ROS, Neg liver ROS,,,  Endo/Other  negative endocrine ROS    Renal/GU negative Renal ROS  negative genitourinary   Musculoskeletal negative musculoskeletal ROS (+)    Abdominal   Peds negative pediatric ROS (+)  Hematology negative hematology ROS (+)   Anesthesia Other Findings   Reproductive/Obstetrics (+) Pregnancy                             Anesthesia Physical Anesthesia Plan  ASA: 2  Anesthesia Plan: Epidural   Post-op Pain Management:    Induction:   PONV Risk Score and Plan:   Airway Management Planned:   Additional Equipment:   Intra-op Plan:   Post-operative Plan:   Informed Consent:   Plan Discussed with:   Anesthesia Plan Comments:        Anesthesia Quick Evaluation  

## 2024-03-16 NOTE — Lactation Note (Addendum)
 This note was copied from a baby's chart. Lactation Consultation Note  Patient Name: Latasha Peters Date: 03/16/2024 Age:24 hours Reason for consult: Initial assessment;Early term 37-38.6wks.  P3, LC did not observe latch, Per MOB, infant breastfeed for 8 minutes prior to [facility] entering the room. Infant has breastfeed 2X, infant latched in L&D for 15 minutes. MOB goal is to breastfeed see maternal history below. MOB knows to continue to breastfeed infant by cues, on demand, 8-12 times within 24 hours, skin to skin. MOB knows to call for further latch assistance if needed. LC discussed the importance of maternal rest, meals and hydration. MOB was made aware of O/P services, breastfeeding support groups, community resources, and our phone # for post-discharge questions.    Maternal Data Has patient been taught Hand Expression?: Yes Does the patient have breastfeeding experience prior to this delivery?: Yes How long did the patient breastfeed?: Per MOB, she stopped breastfeeding her first child at 4 months due to low milk supply and 2nd child did not latch she exclusively pumped for 2 months.  Feeding Mother's Current Feeding Choice: Breast Milk  LATCH Score  MOB finished breastfeed infant prior to [facility] entering the room.                   Lactation Tools Discussed/Used    Interventions Interventions: Breast feeding basics reviewed;Skin to skin;Education;CDC Guidelines for Breast Pump Cleaning;CDC milk storage guidelines;LC Services brochure;Guidelines for Milk Supply and Pumping Schedule Handout  Discharge Pump: DEBP;Personal  Consult Status Consult Status: Follow-up Date: 03/17/24 Follow-up type: In-patient    Latasha Peters 03/16/2024, 10:01 PM

## 2024-03-16 NOTE — Anesthesia Procedure Notes (Signed)
 Epidural Patient location during procedure: OB Start time: 03/16/2024 5:38 PM End time: 03/16/2024 5:53 PM  Staffing Anesthesiologist: Cleotilde Butler Dade, MD Performed: anesthesiologist   Preanesthetic Checklist Completed: patient identified, IV checked, site marked, risks and benefits discussed, surgical consent, monitors and equipment checked, pre-op evaluation and timeout performed  Epidural Patient position: sitting Prep: ChloraPrep Patient monitoring: heart rate, cardiac monitor, continuous pulse ox and blood pressure Approach: midline Location: L2-L3 Injection technique: LOR saline  Needle:  Needle type: Tuohy  Needle gauge: 17 G Needle length: 9 cm Needle insertion depth: 6 cm Catheter type: closed end flexible Catheter size: 20 Guage Catheter at skin depth: 10 cm Test dose: negative  Assessment Events: blood not aspirated, injection not painful, no injection resistance, no paresthesia and negative IV test  Additional Notes Reason for block:procedure for pain

## 2024-03-16 NOTE — Discharge Summary (Signed)
 "    Postpartum Discharge Summary  Date of Service updated***     Patient Name: Latasha Peters DOB: [birth year] MRN: [id number]  Date of admission: 03/15/2024 Delivery date:03/16/2024 Delivering provider: DANNY GERALDS Date of discharge: 03/16/2024  Admitting diagnosis: IUGR (intrauterine growth restriction) affecting care of mother [O36.5990] Intrauterine pregnancy: [redacted]w[redacted]d     Secondary diagnosis:  Principal Problem:   IUGR (intrauterine growth restriction) affecting care of mother Active Problems:   Rh negative state in antepartum period   Rubella non-immune status, antepartum   Fetal growth restriction antepartum   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 yo H5E7987 at [redacted]w[redacted]d was admitted to the hospital 03/15/2024 for induction of labor.  Indication for induction: IUGR.  Patient had an labor course complicated by none Membrane Rupture Time/Date: 1:48 AM,03/16/2024  Delivery Method:Vaginal, Spontaneous Operative Delivery:N/A Episiotomy: None Lacerations:    Details of delivery can be found in separate delivery note.  Patient had a postpartum course complicated by***. Patient is discharged home 03/16/2024.  Newborn Data: Birth date:03/16/2024 Birth time:5:59 PM Gender:Female Living status:Living Apgars:8 ,  Weight:   Magnesium  Sulfate received: {Mag received:30440022} BMZ received: No Rhophylac :{Rhophylac  received:30440032} MMR:{MMR:30440033} RNI, ordered T-DaP:Given prenatally Flu: No declined RSV Vaccine received: No Transfusion:{Transfusion received:30440034}  Immunizations received: Immunization History  Administered Date(s) Administered   DTaP  02/03/2001, 11/02/2002, 08/27/2006, 10/30/2006, 12/30/2006   Dtap, Unspecified 08/27/2006, 10/30/2006, 12/30/2006   HIB (PRP-T) 02/03/2001, 11/02/2002   HIB, Unspecified 08/27/2006, 10/30/2006   HPV 9-valent 04/08/2014   HPV Quadrivalent 11/09/2012, 01/27/2013   Hep B, Unspecified 10/08/2000, 02/03/2001, 11/02/2002   Hepatitis A, Ped/Adol-2 Dose 07/02/2012, 01/27/2013   Hepatitis B, PED/ADOLESCENT 06/30/2000, 02/03/2001, 11/02/2002   IPV 02/03/2001, 11/02/2002   Influenza,inj,Quad PF,6+ Mos 04/08/2014   MMR 08/27/2006, 10/30/2006   Meningococcal Conjugate 12/12/2014   Pneumococcal Conjugate PCV 7 02/03/2001, 08/27/2006, 10/30/2006, 12/30/2006   Pneumococcal-Unspecified 08/27/2006, 10/30/2006, 12/30/2006   Polio, Unspecified 08/27/2006, 10/30/2006   Rho (D) Immune Globulin  01/08/2024   Tdap 07/02/2012, 08/03/2019, 05/01/2021, 01/05/2024   Varicella 08/27/2006, 10/30/2006    Physical exam  Vitals:   03/16/24 1530 03/16/24 1600 03/16/24 1700 03/16/24 1746  BP: 112/72 109/77 130/85 120/62  Pulse: 66 66 78 99  Resp: 18     Temp: 98.2 F (36.8 C)     TempSrc: Axillary     SpO2:    98%  Weight:      Height:       General: {Exam; general:21111117} Lochia: {Desc; appropriate/inappropriate:30686::appropriate} Uterine Fundus: {Desc; firm/soft:30687} Incision: {Exam; incision:21111123} DVT Evaluation: {Exam; dvt:2111122} Labs: Lab Results  Component Value Date   WBC 14.2 (H) 03/16/2024   HGB 11.1 (L) 03/16/2024   HCT 33.4 (L) 03/16/2024   MCV 84.8 03/16/2024   PLT 262 03/16/2024      Latest Ref Rng & Units 06/27/2021   11:17 PM  CMP  Glucose 70 - 99 mg/dL 898   BUN 6 - 20 mg/dL <5   Creatinine 9.55 - 1.00 mg/dL 9.53   Sodium 864 - 854 mmol/L 136  Potassium 3.5 - 5.1 mmol/L 3.7   Chloride 98 - 111 mmol/L 109   CO2 22 - 32 mmol/L 21   Calcium 8.9 - 10.3 mg/dL 8.3   Total Protein 6.5 - 8.1 g/dL 5.8   Total Bilirubin 0.3 - 1.2 mg/dL 0.2   Alkaline Phos 38 - 126 U/L 186    AST 15 - 41 U/L 19   ALT 0 - 44 U/L 19    Edinburgh Score:     No data to display         No data recorded  After visit meds:  Allergies as of 03/16/2024       Reactions   Latex Itching, Rash     Med Rec must be completed prior to using this [facility]***        Discharge home in stable condition Infant Feeding: {Baby feeding:23562} Infant Disposition:{CHL IP OB HOME WITH FNUYZM:76418} Discharge instruction: per After Visit Summary and Postpartum booklet. Activity: Advance as tolerated. Pelvic rest for 6 weeks.  Diet: {OB ipzu:78888878} Future Appointments: Future Appointments  Date Time Provider Department Center  03/30/2024  1:45 PM [facility] HEALTH CLINICIAN [facility]   Follow up Visit: Message sent 1/13  Please schedule this patient for a In person postpartum visit in 6 weeks with the following provider: Any provider. Additional Postpartum F/U:none  High risk pregnancy complicated by: FGR, maternal Chiari malformation Delivery mode:  Vaginal, Spontaneous Anticipated Birth Control:  IUD OP   03/16/2024 Franchot Budge, Student-PA    "

## 2024-03-16 NOTE — H&P (Signed)
 OBSTETRIC ADMISSION HISTORY AND PHYSICAL  Latasha Peters is a 24 y.o. female [phone] with IUP at [redacted]w[redacted]d by ultrasound presenting for IOL 2/2 FGR. She reports +FMs, No LOF, no VB, no blurry vision, headaches or peripheral edema, and RUQ pain.  She plans on breast feeding. She request depo for birth control before leaving the hospital and then will have IUD placed at 6 week OP follow up. . She received her prenatal care at [facility]   Dating: By ultrasound --->  Estimated Date of Delivery: 03/26/24  Sono:    @[redacted]w[redacted]d , CWD, normal anatomy, Cephalic presentation, Anterior, 2587g, 9.98% EFW   Prenatal History/Complications:   NURSING  PROVIDER  Office Location [facility] Dating by LMP c/w U/S at 8 wks  [facility] Model Traditional Anatomy U/S Normal female 'Amara'  Initiated care at  [facility]                 Language  English               LAB RESULTS   Support Person   Genetics NIPS: LR female AFP-OSB: neg       NT/IT (FT only)        Carrier Screen Horizon: neg  Rhogam  O/Negative/-- (07/14 1513) A1C/GTT Early HgbA1C: 5.0 Third trimester 2 hr GTT: normal  Flu Vaccine declined      TDaP Vaccine  11/3 Blood Type O/Negative/-- (07/14 1513)  RSV Vaccine   Antibody Negative (07/14 1513)  COVID Vaccine   Rubella <0.90 (07/14 1513)  Feeding Plan breast RPR Non Reactive (07/14 1513)  Contraception Mirena at ppv HBsAg Negative (07/14 1513)  Circumcision N/a HIV Non Reactive (07/14 1513)  Pediatrician  [facility] HCVAb Non Reactive (07/14 1513)  Prenatal Classes discussed      BTL Consent   Pap 09/15/23 neg  BTL Pre-payment   GC/CT Initial:  -/- 36wks:  -/-  VBAC Consent   GBS  neg For PCN allergy, check sensitivities   BRx Optimized? [ ]  yes   [ ]  no      DME Rx [ ]  BP cuff [ ]  Weight Scale Waterbirth  [ ]  Class [ ]  Consent [ ]  CNM visit  PHQ9 & GAD7 [  ] new OB [  ] 28 weeks  [  ] 36 weeks Induction  [ ]  Orders Entered [ ] Foley Y/N    Past Medical History: Past Medical History:   Diagnosis Date   Anemia    Chiari malformation type I (HCC) 2021    Past Surgical History: Past Surgical History:  Procedure Laterality Date   TONSILLECTOMY  2010    Obstetrical History: OB History     Gravida  4   Para  2   Term  2   Preterm      AB  1   Living  2      SAB  1   IAB      Ectopic      Multiple      Live Births  2           Social History Social History   Socioeconomic History   Marital status: Single    Spouse name: Not on file   Number of children: Not on file   Years of education: Not on file   Highest education level: Not on file  Occupational History   Not on file  Tobacco Use   Smoking status: Former   Smokeless tobacco: Never  Vaping  Use   Vaping status: Former  Substance and Sexual Activity   Alcohol use: Never   Drug use: Never   Sexual activity: Yes    Birth control/protection: None  Other Topics Concern   Not on file  Social History Narrative   Not on file   Social Drivers of Health   Tobacco Use: Medium Risk (03/16/2024)   Patient History    Smoking Tobacco Use: Former    Smokeless Tobacco Use: Never    Passive Exposure: Not on file  Financial Resource Strain: Low Risk (09/15/2023)   Overall Financial Resource Strain (CARDIA)    Difficulty of Paying Living Expenses: Not very hard  Food Insecurity: Low Risk (11/18/2023)   Received from [facility]   Epic    Within the past 12 months, you worried that your food would run out before you got money to buy more: Never true    Within the past 12 months, the food you bought just didn't last and you didn't have money to get more. : Never true  Transportation Needs: Unmet Transportation Needs (11/18/2023)   Received from [facility]    In the past 12 months, has lack of reliable transportation kept you from medical appointments, meetings, work or from getting things needed for daily living? : Yes  Physical Activity: Insufficiently Active  (09/15/2023)   Exercise Vital Sign    Days of Exercise per Week: 3 days    Minutes of Exercise per Session: 20 min  Stress: No Stress Concern Present (09/15/2023)   [employer] of Occupational Health - Occupational Stress Questionnaire    Feeling of Stress: Not at all  Social Connections: Socially Integrated (09/15/2023)   Social Connection and Isolation Panel    Frequency of Communication with Friends and Family: Three times a week    Frequency of Social Gatherings with Friends and Family: More than three times a week    Attends Religious Services: More than 4 times per year    Active Member of Clubs or Organizations: Yes    Attends Banker Meetings: Never    Marital Status: Living with partner  Depression (PHQ2-9): Medium Risk (01/15/2024)   Depression (PHQ2-9)    PHQ-2 Score: 7  Alcohol Screen: Low Risk (09/15/2023)   Alcohol Screen    Last Alcohol Screening Score (AUDIT): 0  Housing: Medium Risk (11/18/2023)   Received from [facility]   Epic    What is your living situation today?: I have a place to live today, but I am worried about losing it in the future    Think about the place you live. Do you have problems with any of the following? Choose all that apply:: Pt declined to answer  Utilities: Low Risk (11/18/2023)   Received from [facility]   Utilities    In the past 12 months has the electric, gas, oil, or water company threatened to shut off services in your home? : No  Health Literacy: Low Risk (04/03/2021)   Received from [facility] Literacy    How often do you need to have someone help you when you read instructions, pamphlets, or other written material from your doctor or pharmacy?: Never    Family History: Family History  Problem Relation Age of Onset   Hypertension Father     Allergies: Allergies[1]  Medications Prior to Admission  Medication Sig Dispense Refill Last Dose/Taking   acetaminophen  (TYLENOL ) 650 MG CR tablet  Take 1,000  mg by mouth every 8 (eight) hours as needed for pain.   Past Week   Prenatal Vit-Fe Fumarate-FA (M-NATAL PLUS ) 27-1 MG TABS ONE TABLET AND ONE CAPSULE DAILY 90 tablet 3 Past Week Evening   Blood Pressure Monitor MISC For regular home bp monitoring during pregnancy 1 each 0    butalbital -acetaminophen -caffeine  (FIORICET) 50-325-40 MG tablet Take 1 tablet by mouth every 4 (four) hours as needed for headache or migraine. (Patient not taking: Reported on 03/15/2024) 20 tablet 0    hydrOXYzine  (ATARAX ) 10 MG tablet Take 1 tablet (10 mg total) by mouth at bedtime as needed for anxiety. 30 tablet 0      Review of Systems   All systems reviewed and negative except as stated in HPI  Blood pressure (!) 104/57, pulse 73, temperature 98.3 F (36.8 C), temperature source Oral, resp. rate 15, height 5' 3 (1.6 m), weight 85.3 kg, last menstrual period 05/25/2023, SpO2 98%, unknown if currently breastfeeding. General appearance: alert, cooperative, and appears stated age Lungs: clear to auscultation bilaterally Heart: regular rate and rhythm Abdomen: soft, non-tender; bowel sounds normal Extremities: Homans sign is negative, no sign of DVT DTR's intact Presentation: cephalic Fetal monitoringBaseline: 120 bpm, Variability: Good {> 6 bpm), and Accelerations: Reactive Uterine activityNone Dilation: 3 Effacement (%): 50 Station: -3 Exam by:: [employer] RN   Prenatal labs: ABO, Rh: --/--/PENDING (01/13 0043) Antibody: PENDING (01/13 0043) Rubella: <0.90 (07/14 1513) RPR: Non Reactive (11/06 0914)  HBsAg: Negative (07/14 1513)  HIV: Non Reactive (11/06 0914)  GBS: Negative/-- (12/29 1715)    Lab Results  Component Value Date   GBS Negative 03/01/2024   GTT: passed Genetic screening: no abnormal findings Anatomy US : WNL  Immunization History  Administered Date(s) Administered   DTaP 02/03/2001, 11/02/2002, 08/27/2006, 10/30/2006, 12/30/2006   Dtap, Unspecified 08/27/2006,  10/30/2006, 12/30/2006   HIB (PRP-T) 02/03/2001, 11/02/2002   HIB, Unspecified 08/27/2006, 10/30/2006   HPV 9-valent 04/08/2014   HPV Quadrivalent 11/09/2012, 01/27/2013   Hep B, Unspecified 11-Mar-2000, 02/03/2001, 11/02/2002   Hepatitis A, Ped/Adol-2 Dose 07/02/2012, 01/27/2013   Hepatitis B, PED/ADOLESCENT 09/01/2000, 02/03/2001, 11/02/2002   IPV 02/03/2001, 11/02/2002   Influenza,inj,Quad PF,6+ Mos 04/08/2014   MMR 08/27/2006, 10/30/2006   Meningococcal Conjugate 12/12/2014   Pneumococcal Conjugate PCV 7 02/03/2001, 08/27/2006, 10/30/2006, 12/30/2006   Pneumococcal-Unspecified 08/27/2006, 10/30/2006, 12/30/2006   Polio, Unspecified 08/27/2006, 10/30/2006   Rho (D) Immune Globulin  01/08/2024   Tdap 07/02/2012, 08/03/2019, 05/01/2021, 01/05/2024   Varicella 08/27/2006, 10/30/2006    Prenatal Transfer Tool  Maternal Diabetes: No Genetic Screening: Normal Maternal Ultrasounds/Referrals: IUGR Fetal Ultrasounds or other Referrals:  Referred to Materal Fetal Medicine  Maternal Substance Abuse:  No Significant Maternal Medications:  None Significant Maternal Lab Results: Group B Strep negative and Rh negative Number of Prenatal Visits:greater than 3 verified prenatal visits Maternal Vaccinations:TDap, declined Flu, RSV, COVID Other Comments:  None   Results for orders placed or performed during the hospital encounter of 03/15/24 (from the past 24 hours)  CBC   Collection Time: 03/16/24 12:18 AM  Result Value Ref Range   WBC 14.2 (H) 4.0 - 10.5 K/uL   RBC 3.94 3.87 - 5.11 MIL/uL   Hemoglobin 11.1 (L) 12.0 - 15.0 g/dL   HCT 66.5 (L) 63.9 - 53.9 %   MCV 84.8 80.0 - 100.0 fL   MCH 28.2 26.0 - 34.0 pg   MCHC 33.2 30.0 - 36.0 g/dL   RDW 85.9 88.4 - 84.4 %   Platelets 262 150 - 400  K/uL   nRBC 0.0 0.0 - 0.2 %  Type and screen   Collection Time: 03/16/24 12:43 AM  Result Value Ref Range   ABO/RH(D) PENDING    Antibody Screen PENDING    Sample Expiration       03/19/2024,2359 Performed at [facility], [address] [place], [place] [address]     Patient Active Problem List   Diagnosis Date Noted   IUGR (intrauterine growth restriction) affecting care of mother 03/15/2024   Chiari I malformation [facility]): MRI ordered for eval in anticipation of regional anesthetic 01/19/2024   Fetal growth restriction antepartum 12/11/2023   Rubella non-immune status, antepartum 09/16/2023   Supervision of high risk pregnancy, antepartum 09/15/2023   Rh negative state in antepartum period 09/09/2023   Anxiety disorder 02/03/2023   History of Chiari malformation 08/14/2020    Assessment/Plan:  Latasha Peters is a 24 y.o. [id number] at [redacted]w[redacted]d here for IOL 2/2 FGR.  #Labor: IOL explained to patient. She would like to avoid epidural if possible. Using shared decision making, AROM performed. Will hold off on pitocin  for now and evaluate for spontaneous onset of labor. #Pain: Per patient request, will get epidural if needed but trying to avoid.  #FWB: Cat 1 #GBS status: negative #Rh (-) state: Rho-GAM post partum #Rubella non-immune: MMR will be offered post-partum #Feeding: Breastmilk  #Reproductive Life planning: Depo prior to d/c, IUD at 6w PP visit.  #Circ:  not applicable  Eliezer Dickens, Medical Student  03/16/2024, 1:26 AM        [1]  Allergies Allergen Reactions   Latex Itching and Rash

## 2024-03-16 NOTE — Progress Notes (Addendum)
 LABOR PROGRESS NOTE Pt rechecked at 1400 Patient comfortable without epidural.  SCE: 4/60/-2   1430 FHT: baseline 130, mod variability, +accels, -decels; overall category I Toco: q25min   A/P:  AROM performed again.  Pitocin  started.  Reassess in 4 hours.   Franchot Budge, Student-PA 2:33 PM  Attestation of Supervision of Resident: Evaluation and management procedures were performed by the learners: PA Student under my supervision. I was immediately available for direct supervision, assistance and direction throughout this encounter.  I also confirm that I have verified the information documented in the residents note, and that I have also personally reperformed the pertinent components of the physical exam and all of the medical decision making activities.  I have also made any necessary editorial changes.  Barabara Maier, DO FM-OB Fellow [facility] for [employer]

## 2024-03-16 NOTE — Progress Notes (Signed)
 LABOR PROGRESS NOTE Pt rechecked at 0945 Patient comfortable without epidural. Pit at none. SCE: 3/long/-3  FHT: baseline 150, mod variability, +accels, -decels; overall category I Toco: none  A/P:  Oral Cytotec  50mcg now, then pit at next check. AROM 8h ago, multipara status, start pit at next check.  Barabara Maier, DO 9:56 AM

## 2024-03-17 MED ORDER — RHO D IMMUNE GLOBULIN 1500 UNIT/2ML IJ SOSY
300.0000 ug | PREFILLED_SYRINGE | Freq: Once | INTRAMUSCULAR | Status: AC
  Administered 2024-03-17: 300 ug via INTRAVENOUS
  Filled 2024-03-17: qty 2

## 2024-03-17 MED ORDER — DIBUCAINE (PERIANAL) 1 % EX OINT: 1.0000 | TOPICAL_OINTMENT | CUTANEOUS | Status: AC | PRN

## 2024-03-17 MED ORDER — WITCH HAZEL-GLYCERIN EX PADS: 1.0000 | MEDICATED_PAD | CUTANEOUS | Status: AC | PRN

## 2024-03-17 MED ORDER — IBUPROFEN 600 MG PO TABS
600.0000 mg | ORAL_TABLET | Freq: Four times a day (QID) | ORAL | 0 refills | Status: AC
  Filled 2024-03-17: qty 30, 8d supply, fill #0

## 2024-03-17 MED ORDER — COCONUT OIL OIL: 1.0000 | TOPICAL_OIL | Status: AC | PRN

## 2024-03-17 MED ORDER — BENZOCAINE-MENTHOL 20-0.5 % EX AERO: 1.0000 | INHALATION_SPRAY | CUTANEOUS | Status: AC | PRN

## 2024-03-17 NOTE — Progress Notes (Addendum)
 POSTPARTUM PROGRESS NOTE  Post Partum Day #1  Subjective: Latasha Peters is a 24 y.o. [id number] s/p SVD at [redacted]w[redacted]d.  No acute events overnight.  Pt denies problems with ambulating, voiding or po intake.  She denies nausea or vomiting.  Pain is well controlled.  She has had flatus. She has had a small bowel movement.  Lochia Minimal.   Objective: Blood pressure 112/79, pulse 80, temperature 98 F (36.7 C), resp. rate 17, height 5' 3 (1.6 m), weight 85.3 kg, last menstrual period 05/25/2023, SpO2 99%, unknown if currently breastfeeding.  Physical Exam:  General: alert, cooperative and no distress Chest: no respiratory distress Heart: regular rate Abdomen: soft, nontender,  Uterine Fundus: firm, appropriately tender DVT Evaluation: No calf swelling or tenderness Extremities: Mild edema Skin: warm, dry  Recent Labs    03/16/24 0018  HGB 11.1*  HCT 33.4*   Assessment/Plan: Latasha Peters is a 24 y.o. [id number] s/p SVD at [redacted]w[redacted]d   PPD#1 - Doing well. Desires to be discharged tonight (after 24 hrs). Informed that it would be a late discharge depending upon NB assessment. Patient verbalized understanding and still strongly expressed that she would desire to be discharged tonight due to transportation availability. Will plan to make night team aware.   1. Continue routine postpartum care  2. Infant feeding status: breast feeding -Lactation consult PRN for breastfeeding   3. Contraception plan: Depo-Provera   4. Acute blood loss anemia - clinically not significant .  -Hemodynamically stable and asymptomatic -Intervention: No intervention   5. Rh Negative - Rhogam given today  (03/17/2024) at 1011  Dispo: Plan for discharge tonight depending upon NB status.    LOS: 2 days   Cathaleen Korol, CNM, CNM 03/17/2024, 12:18 PM

## 2024-03-17 NOTE — Lactation Note (Signed)
 This note was copied from a baby's chart. Lactation Consultation Note  Patient Name: Latasha Peters Date: 03/17/2024 Age:24 hours Reason for consult: Maternal discharge P3, Per MOB, infant is breastfeeding well most feedings today are 30 to 40 minutes in length most feedings . MOB is feeding infant by cues, on demand, 8+ times within 24 hours, skin to skin. MOB does not have any questions or concerns for LC at this time. LC discussed discharge education: 1- Engorgement treatment and prevention, 2- How to know breastfeeding is going well and 3- Warning signs of dehydration in infant. MOB has discharge community resources: [facility] hotline, LC breastfeeding support group and [facility] outpatient clinic.   Maternal Data    Feeding Mother's Current Feeding Choice: Breast Milk  LATCH Score Latch: Grasps breast easily, tongue down, lips flanged, rhythmical sucking.  Audible Swallowing: A few with stimulation  Type of Nipple: Everted at rest and after stimulation  Comfort (Breast/Nipple): Soft / non-tender  Hold (Positioning): No assistance needed to correctly position infant at breast.  LATCH Score: 9   Lactation Tools Discussed/Used    Interventions Interventions: Skin to skin;Position options;Education  Discharge Discharge Education: Engorgement and breast care;Warning signs for feeding baby Pump: DEBP;Personal  Consult Status Consult Status: Complete Date: 03/17/24 Follow-up type: Physician    Grayce LULLA Batter 03/17/2024, 8:02 PM

## 2024-03-17 NOTE — Social Work (Signed)
 CSW received consult for hx of Anxiety and Depression.  CSW met with MOB to offer support and complete assessment.  CSW entered the room and observed MOB resting in bed holding the infant and FOB at bedside. CSW inquired about how MOB was feeling, MOB reported good. CSW inquired about MOB MH hx, MOB reported she was diagnosed at age 24 and stopped taking antidepressants at age 60. MOB reported she does not feel like she has depression. CSW inquired about current treatment, MOB reported she is seeing Warren with [facility] and has her next session on 1/27. MOB reported the sessions have been beneficial for her MH. CSW provided education regarding the baby blues period vs. perinatal mood disorders, discussed treatment and gave resources for mental health follow up if concerns arise.  CSW recommends self-evaluation during the postpartum time period using the New Mom Checklist from Postpartum Progress and encouraged MOB to contact a medical professional if symptoms are noted at any time. MOB identified FOB and her sisters as her primary supports.   CSW provided review of Sudden Infant Death Syndrome (SIDS) precautions.  MOB reported she has all essential items for the infant including a bassinet and car seat.  CSW identifies no further need for intervention and no barriers to discharge at this time.  Owen Pagnotta, LCSWA Clinical Social Worker [phone]

## 2024-03-17 NOTE — Anesthesia Postprocedure Evaluation (Signed)
"   Anesthesia Post Note  Patient: Melisa Donofrio  Procedure(s) Performed: AN AD HOC LABOR EPIDURAL     Patient location during evaluation: Mother Baby Anesthesia Type: Epidural Level of consciousness: awake and alert Pain management: pain level controlled Vital Signs Assessment: post-procedure vital signs reviewed and stable Respiratory status: spontaneous breathing, nonlabored ventilation and respiratory function stable Cardiovascular status: stable Postop Assessment: no headache, no backache and epidural receding Anesthetic complications: no   No notable events documented.  Last Vitals:  Vitals:   03/17/24 0145 03/17/24 0550  BP: (!) 106/53 (!) 110/54  Pulse: (!) 58 71  Resp: 18 17  Temp: 36.9 C 36.6 C  SpO2: 99% 99%    Last Pain:  Vitals:   03/17/24 0551  TempSrc:   PainSc: 0-No pain   Pain Goal:                   Ragina Fenter      "

## 2024-03-17 NOTE — Progress Notes (Signed)
 POSTPARTUM PROGRESS NOTE  Post Partum Day 1  Subjective:  Latasha Peters is a 24 y.o. [id number] s/p NVD at [redacted]w[redacted]d.  No acute events overnight.  Pt denies problems with ambulating, voiding or po intake.  She denies nausea or vomiting.  Pain is well controlled.  She currently rates her pain as a 2/10. She has had flatus. She has not had bowel movement.  Lochia Moderate.   Objective: Blood pressure (!) 110/54, pulse 71, temperature 97.9 F (36.6 C), temperature source Oral, resp. rate 17, height 5' 3 (1.6 m), weight 85.3 kg, last menstrual period 05/25/2023, SpO2 99%, unknown if currently breastfeeding.  Physical Exam:  General: alert, cooperative and no distress Chest: no respiratory distress Heart:regular rate, distal pulses intact Abdomen: soft, nontender,  Uterine Fundus: firm, appropriately tender DVT Evaluation: No calf swelling or tenderness Extremities: No edema Skin: warm, dry  Recent Labs    03/16/24 0018  HGB 11.1*  HCT 33.4*    Assessment/Plan: Latasha Peters is a 24 y.o. [phone] with pregnancy complicated by IUGR and maternal Chiari malformation s/p NVD at [redacted]w[redacted]d. BP WNL. CBC acceptable.   #Rh incompatibility -Baby Rh positive.  -Rhogam ordered.   PPD#1 - Doing well Contraception: Depo then IUD at 6 week postpartum visit. Feeding: breast Dispo: Plan for discharge tomorrow 03/18/24.   LOS: 2 days   Latasha Peters, [name]  03/17/2024, 8:33 AM

## 2024-03-18 ENCOUNTER — Other Ambulatory Visit

## 2024-03-18 ENCOUNTER — Other Ambulatory Visit (HOSPITAL_COMMUNITY): Payer: Self-pay

## 2024-03-18 LAB — RH IG WORKUP (INCLUDES ABO/RH)
Fetal Screen: NEGATIVE
Gestational Age(Wks): 38.4
Unit division: 0

## 2024-03-22 ENCOUNTER — Other Ambulatory Visit: Admitting: Radiology

## 2024-03-22 ENCOUNTER — Encounter: Admitting: Obstetrics & Gynecology

## 2024-03-25 ENCOUNTER — Other Ambulatory Visit

## 2024-03-26 ENCOUNTER — Telehealth (HOSPITAL_COMMUNITY): Payer: Self-pay | Admitting: *Deleted

## 2024-03-26 NOTE — Telephone Encounter (Signed)
 03/26/2024  Name: Latasha Peters MRN: [id number] DOB: 03/02/2001  Reason for Call:  Transition of Care Hospital Discharge Call  Contact Status: Patient Contact Status: Complete  Language assistant needed: Interpreter Mode: Interpreter Not Needed        Follow-Up Questions: Do You Have Any Concerns About Your Health As You Heal From Delivery?: No Do You Have Any Concerns About Your Infants Health?: No  Edinburgh Postnatal Depression Scale:  In the Past 7 Days: I have been able to laugh and see the funny side of things.: As much as I always could I have looked forward with enjoyment to things.: As much as I ever did I have blamed myself unnecessarily when things went wrong.: Not very often I have been anxious or worried for no good reason.: Hardly ever I have felt scared or panicky for no good reason.: No, not at all Things have been getting on top of me.: No, I have been coping as well as ever I have been so unhappy that I have had difficulty sleeping.: Not at all I have felt sad or miserable.: No, not at all I have been so unhappy that I have been crying.: No, never The thought of harming myself has occurred to me.: Never Edinburgh Postnatal Depression Scale Total: 2  PHQ2-9 Depression Scale:     Discharge Follow-up: Edinburgh score requires follow up?: No Patient was advised of the following resources:: Support Group, Breastfeeding Support Group  Post-discharge interventions: Reviewed Newborn Safe Sleep Practices  Mliss Sieve, RN 03/26/2024 11:35

## 2024-03-26 NOTE — BH Specialist Note (Signed)
 Pt did not arrive to video visit and did not answer the phone; Left HIPPA-compliant message to call back Warren from [employer] for [employer] at [facility] at  [phone] [facility] office).  ?; left MyChart message for patient.  ? ?

## 2024-03-30 ENCOUNTER — Ambulatory Visit: Payer: Self-pay | Admitting: Clinical

## 2024-03-30 DIAGNOSIS — Z91199 Patient's noncompliance with other medical treatment and regimen due to unspecified reason: Secondary | ICD-10-CM

## 2024-04-21 ENCOUNTER — Ambulatory Visit: Admitting: Obstetrics and Gynecology
